# Patient Record
Sex: Female | Born: 1990 | Race: Black or African American | Hispanic: No | Marital: Single | Smoking: Current some day smoker
Health system: Southern US, Community
[De-identification: ages and names within clinical notes are randomized; demographics above are authoritative.]

## PROBLEM LIST (undated history)

## (undated) ENCOUNTER — Inpatient Hospital Stay (HOSPITAL_COMMUNITY): Payer: Self-pay

## (undated) DIAGNOSIS — T85848A Pain due to other internal prosthetic devices, implants and grafts, initial encounter: Secondary | ICD-10-CM

## (undated) DIAGNOSIS — S82891A Other fracture of right lower leg, initial encounter for closed fracture: Secondary | ICD-10-CM

## (undated) HISTORY — PX: FRACTURE SURGERY: SHX138

## (undated) HISTORY — PX: HERNIA REPAIR: SHX51

---

## 2012-02-29 ENCOUNTER — Encounter (HOSPITAL_COMMUNITY): Payer: Self-pay | Admitting: *Deleted

## 2012-02-29 ENCOUNTER — Emergency Department (HOSPITAL_COMMUNITY)
Admission: EM | Admit: 2012-02-29 | Discharge: 2012-02-29 | Disposition: A | Discharge status: Home / Self Care | Payer: Medicaid Other | Attending: Emergency Medicine | Admitting: Emergency Medicine

## 2012-02-29 DIAGNOSIS — N390 Urinary tract infection, site not specified: Secondary | ICD-10-CM | POA: Insufficient documentation

## 2012-02-29 LAB — URINE MICROSCOPIC-ADD ON

## 2012-02-29 LAB — URINALYSIS, ROUTINE W REFLEX MICROSCOPIC
Glucose, UA: NEGATIVE mg/dL
Protein, ur: 100 mg/dL — AB
Specific Gravity, Urine: 1.029 (ref 1.005–1.030)
pH: 6 (ref 5.0–8.0)

## 2012-02-29 MED ORDER — NITROFURANTOIN MONOHYD MACRO 100 MG PO CAPS
100.0000 mg | ORAL_CAPSULE | Freq: Once | ORAL | Status: AC
  Administered 2012-02-29: 100 mg via ORAL
  Filled 2012-02-29: qty 1

## 2012-02-29 MED ORDER — NITROFURANTOIN MONOHYD MACRO 100 MG PO CAPS: 100.0000 mg | ORAL_CAPSULE | Freq: Two times a day (BID) | ORAL | Status: AC

## 2012-02-29 MED ORDER — PHENAZOPYRIDINE HCL 200 MG PO TABS: 200.0000 mg | ORAL_TABLET | Freq: Three times a day (TID) | ORAL | Status: AC

## 2012-02-29 NOTE — Discharge Instructions (Signed)

## 2012-02-29 NOTE — ED Provider Notes (Signed)
History     CSN: [id number]  Arrival date & time 02/29/12  2956   First MD Initiated Contact with Patient 02/29/12 0220      Chief Complaint  Patient presents with  . Dysuria    (Consider location/radiation/quality/duration/timing/severity/associated sxs/prior treatment) Patient is a 21 y.o. female presenting with dysuria. The history is provided by the patient. No language interpreter was used.  Dysuria  This is a new problem. The current episode started more than 2 days ago (7 days ago). The problem occurs every urination. The problem has been gradually worsening. The quality of the pain is described as burning. The pain is moderate. There has been no fever. She is sexually active. There is no history of pyelonephritis. Associated symptoms include frequency and urgency. Pertinent negatives include no chills, no nausea, no vomiting, no discharge, no hematuria, no hesitancy, no possible pregnancy and no flank pain. Her past medical history does not include recurrent UTIs.    History reviewed. No pertinent past medical history.  History reviewed. No pertinent past surgical history.  No family history on file.  History  Substance Use Topics  . Smoking status: Not on file  . Smokeless tobacco: Not on file  . Alcohol Use: No    OB History    Grav Para Term Preterm Abortions TAB SAB Ect Mult Living                  Review of Systems  Constitutional: Negative for fever, chills, activity change, appetite change and fatigue.  HENT: Negative for congestion, sore throat, rhinorrhea, neck pain and neck stiffness.   Respiratory: Negative for cough and shortness of breath.   Cardiovascular: Negative for chest pain and palpitations.  Gastrointestinal: Negative for nausea, vomiting and abdominal pain.  Genitourinary: Positive for dysuria, urgency and frequency. Negative for hesitancy, hematuria, flank pain, vaginal bleeding and vaginal discharge.  Musculoskeletal: Negative for  myalgias, back pain and arthralgias.  Neurological: Negative for dizziness, weakness, light-headedness, numbness and headaches.  All other systems reviewed and are negative.    Allergies  Review of patient's allergies indicates no known allergies.  Home Medications   Current Outpatient Rx  Name Route Sig Dispense Refill  . NITROFURANTOIN MONOHYD MACRO 100 MG PO CAPS Oral Take 1 capsule (100 mg total) by mouth 2 (two) times daily. 14 capsule 0  . PHENAZOPYRIDINE HCL 200 MG PO TABS Oral Take 1 tablet (200 mg total) by mouth 3 (three) times daily. 6 tablet 0    BP 127/65  Pulse 88  Temp(Src) 98.3 F (36.8 C) (Oral)  Resp 19  SpO2 100%  Physical Exam  Nursing note and vitals reviewed. Constitutional: She is oriented to person, place, and time. She appears well-developed and well-nourished. No distress.  HENT:  Head: Normocephalic and atraumatic.  Mouth/Throat: Oropharynx is clear and moist. No oropharyngeal exudate.  Eyes: Conjunctivae and EOM are normal. Pupils are equal, round, and reactive to light.  Neck: Normal range of motion. Neck supple.  Cardiovascular: Normal rate, regular rhythm, normal heart sounds and intact distal pulses.  Exam reveals no gallop and no friction rub.   No murmur heard. Pulmonary/Chest: Effort normal and breath sounds normal.  Abdominal: Soft. Bowel sounds are normal. There is tenderness (mild suprapubic abdominal pain). There is no rebound and no guarding.  Musculoskeletal: Normal range of motion. She exhibits no tenderness.  Neurological: She is alert and oriented to person, place, and time. No cranial nerve deficit.  Skin: Skin is warm and dry.  No rash noted.    ED Course  Procedures (including critical care time)  Labs Reviewed  URINALYSIS, ROUTINE W REFLEX MICROSCOPIC - Abnormal; Notable for the following:    APPearance TURBID (*)    Hgb urine dipstick LARGE (*)    Protein, ur 100 (*)    Nitrite POSITIVE (*)    Leukocytes, UA MODERATE  (*)    All other components within normal limits  URINE MICROSCOPIC-ADD ON - Abnormal; Notable for the following:    Squamous Epithelial / LPF MANY (*)    Bacteria, UA MANY (*)    All other components within normal limits   No results found.   1. UTI (lower urinary tract infection)       MDM  Urinary tract infection with no evidence of pyelonephritis. She received a dose of Macrobid and numerous department. Be discharged home with a prescription for the same as well as a prescription for Pyridium. Is provided prescription return precautions. Struck to followup with her primary care physician         Dayton Bailiff, MD 02/29/12 [phone]

## 2012-02-29 NOTE — ED Notes (Signed)
Pt began having lower abdominal/bladder pain x 1 week ago.  Pt presents tonight with worsening pain and pressure.  Pt describes pain as like that associated with bladder infections in the past.

## 2013-05-10 DIAGNOSIS — S82891A Other fracture of right lower leg, initial encounter for closed fracture: Secondary | ICD-10-CM

## 2013-05-10 HISTORY — DX: Other fracture of right lower leg, initial encounter for closed fracture: S82.891A

## 2013-05-10 HISTORY — PX: ANKLE FRACTURE SURGERY: SHX122

## 2013-05-14 ENCOUNTER — Ambulatory Visit: Payer: Self-pay

## 2013-05-15 ENCOUNTER — Ambulatory Visit: Payer: Self-pay

## 2013-05-19 ENCOUNTER — Ambulatory Visit: Payer: Self-pay

## 2013-05-21 ENCOUNTER — Ambulatory Visit (INDEPENDENT_AMBULATORY_CARE_PROVIDER_SITE_OTHER): Payer: Medicaid Other | Admitting: Family Medicine

## 2013-05-21 DIAGNOSIS — Z3049 Encounter for surveillance of other contraceptives: Secondary | ICD-10-CM

## 2013-05-21 DIAGNOSIS — Z309 Encounter for contraceptive management, unspecified: Secondary | ICD-10-CM

## 2013-05-21 MED ORDER — MEDROXYPROGESTERONE ACETATE 150 MG/ML IM SUSP
150.0000 mg | Freq: Once | INTRAMUSCULAR | Status: AC
  Administered 2013-05-21: 150 mg via INTRAMUSCULAR

## 2013-05-23 ENCOUNTER — Emergency Department (HOSPITAL_COMMUNITY): Payer: Medicaid Other

## 2013-05-23 ENCOUNTER — Encounter (HOSPITAL_COMMUNITY)
Admission: EM | Disposition: A | Discharge status: Home / Self Care | Payer: Self-pay | Source: Home / Self Care | Attending: Emergency Medicine

## 2013-05-23 ENCOUNTER — Encounter (HOSPITAL_COMMUNITY): Payer: Self-pay | Admitting: Anesthesiology

## 2013-05-23 ENCOUNTER — Encounter (HOSPITAL_COMMUNITY): Payer: Self-pay

## 2013-05-23 ENCOUNTER — Emergency Department (HOSPITAL_COMMUNITY): Payer: Medicaid Other | Admitting: Anesthesiology

## 2013-05-23 ENCOUNTER — Observation Stay (HOSPITAL_COMMUNITY)
Admission: EM | Admit: 2013-05-23 | Discharge: 2013-05-25 | Disposition: A | Discharge status: Home / Self Care | Payer: Medicaid Other | Attending: Orthopedic Surgery | Admitting: Orthopedic Surgery

## 2013-05-23 DIAGNOSIS — S82843B Displaced bimalleolar fracture of unspecified lower leg, initial encounter for open fracture type I or II: Principal | ICD-10-CM | POA: Insufficient documentation

## 2013-05-23 DIAGNOSIS — S0990XA Unspecified injury of head, initial encounter: Secondary | ICD-10-CM

## 2013-05-23 DIAGNOSIS — S93439A Sprain of tibiofibular ligament of unspecified ankle, initial encounter: Secondary | ICD-10-CM | POA: Insufficient documentation

## 2013-05-23 DIAGNOSIS — S82841C Displaced bimalleolar fracture of right lower leg, initial encounter for open fracture type IIIA, IIIB, or IIIC: Secondary | ICD-10-CM

## 2013-05-23 DIAGNOSIS — S86909A Unspecified injury of unspecified muscle(s) and tendon(s) at lower leg level, unspecified leg, initial encounter: Secondary | ICD-10-CM | POA: Insufficient documentation

## 2013-05-23 DIAGNOSIS — S82899B Other fracture of unspecified lower leg, initial encounter for open fracture type I or II: Secondary | ICD-10-CM

## 2013-05-23 HISTORY — PX: I & D EXTREMITY: SHX5045

## 2013-05-23 HISTORY — PX: ORIF ANKLE FRACTURE: SHX5408

## 2013-05-23 LAB — BASIC METABOLIC PANEL WITH GFR
CO2: 19 meq/L (ref 19–32)
Calcium: 9 mg/dL (ref 8.4–10.5)
Chloride: 103 meq/L (ref 96–112)
Glucose, Bld: 107 mg/dL — ABNORMAL HIGH (ref 70–99)
Sodium: 138 meq/L (ref 135–145)

## 2013-05-23 LAB — BASIC METABOLIC PANEL
BUN: 14 mg/dL (ref 6–23)
Creatinine, Ser: 0.79 mg/dL (ref 0.50–1.10)
GFR calc Af Amer: >90 mL/min (ref >90)
GFR calc non Af Amer: >90 mL/min (ref >90)
Potassium: 3.3 mEq/L — ABNORMAL LOW (ref 3.5–5.1)

## 2013-05-23 LAB — CBC WITH DIFFERENTIAL/PLATELET
Basophils Absolute: 0.1 K/uL (ref 0.0–0.1)
Basophils Relative: 1 % (ref 0–1)
Eosinophils Absolute: 0 10*3/uL (ref 0.0–0.7)
Eosinophils Relative: 1 % (ref 0–5)
HCT: 41.3 % (ref 36.0–46.0)
Hemoglobin: 14 g/dL (ref 12.0–15.0)
Lymphocytes Relative: 27 % (ref 12–46)
Lymphs Abs: 1.7 K/uL (ref 0.7–4.0)
MCH: 26.2 pg (ref 26.0–34.0)
MCHC: 33.9 g/dL (ref 30.0–36.0)
MCV: 77.2 fL — ABNORMAL LOW (ref 78.0–100.0)
Monocytes Absolute: 0.4 K/uL (ref 0.1–1.0)
Monocytes Relative: 7 % (ref 3–12)
Neutro Abs: 4 K/uL (ref 1.7–7.7)
Neutrophils Relative %: 64 % (ref 43–77)
Platelets: 295 K/uL (ref 150–400)
RBC: 5.35 MIL/uL — ABNORMAL HIGH (ref 3.87–5.11)
RDW: 14.6 % (ref 11.5–15.5)
WBC: 6.3 K/uL (ref 4.0–10.5)

## 2013-05-23 LAB — URINALYSIS, ROUTINE W REFLEX MICROSCOPIC
Glucose, UA: NEGATIVE mg/dL
Hgb urine dipstick: NEGATIVE
Ketones, ur: NEGATIVE mg/dL
Leukocytes, UA: NEGATIVE
Nitrite: NEGATIVE
Protein, ur: NEGATIVE mg/dL
Specific Gravity, Urine: 1.025 (ref 1.005–1.030)
Urobilinogen, UA: 1 mg/dL (ref 0.0–1.0)
pH: 5 (ref 5.0–8.0)

## 2013-05-23 LAB — SAMPLE TO BLOOD BANK

## 2013-05-23 LAB — HCG, SERUM, QUALITATIVE: Preg, Serum: NEGATIVE

## 2013-05-23 LAB — ETHANOL: Alcohol, Ethyl (B): <11 mg/dL (ref 0–11)

## 2013-05-23 SURGERY — IRRIGATION AND DEBRIDEMENT EXTREMITY
Anesthesia: General | Site: Ankle | Laterality: Right | Wound class: Clean Contaminated

## 2013-05-23 MED ORDER — HYDROMORPHONE HCL PF 1 MG/ML IJ SOLN
0.2500 mg | INTRAMUSCULAR | Status: DC | PRN
  Administered 2013-05-23: 0.5 mg via INTRAVENOUS

## 2013-05-23 MED ORDER — SODIUM CHLORIDE 0.9 % IR SOLN
Status: DC | PRN
  Administered 2013-05-23 (×2): 3000 mL
  Administered 2013-05-23: 1000 mL

## 2013-05-23 MED ORDER — ONDANSETRON HCL 4 MG/2ML IJ SOLN: 4.0000 mg | Freq: Four times a day (QID) | INTRAMUSCULAR | Status: DC | PRN

## 2013-05-23 MED ORDER — ENOXAPARIN SODIUM 40 MG/0.4ML ~~LOC~~ SOLN: 40.0000 mg | SUBCUTANEOUS | Status: DC

## 2013-05-23 MED ORDER — POTASSIUM CHLORIDE IN NACL 20-0.9 MEQ/L-% IV SOLN
Freq: Once | INTRAVENOUS | Status: AC
  Administered 2013-05-23: 06:00:00 via INTRAVENOUS
  Filled 2013-05-23: qty 1000

## 2013-05-23 MED ORDER — SODIUM CHLORIDE 0.9 % IV SOLN
Freq: Once | INTRAVENOUS | Status: AC
  Administered 2013-05-23: 02:00:00 via INTRAVENOUS

## 2013-05-23 MED ORDER — PROPOFOL 10 MG/ML IV BOLUS
INTRAVENOUS | Status: DC | PRN
  Administered 2013-05-23: 50 mg via INTRAVENOUS
  Administered 2013-05-23: 150 mg via INTRAVENOUS

## 2013-05-23 MED ORDER — HYDROMORPHONE HCL PF 1 MG/ML IJ SOLN
INTRAMUSCULAR | Status: AC
  Administered 2013-05-23: 0.5 mg via INTRAVENOUS
  Filled 2013-05-23: qty 1

## 2013-05-23 MED ORDER — OXYCODONE HCL 5 MG PO TABS
5.0000 mg | ORAL_TABLET | ORAL | Status: DC | PRN
  Administered 2013-05-23 – 2013-05-24 (×4): 10 mg via ORAL
  Filled 2013-05-23 (×4): qty 2

## 2013-05-23 MED ORDER — DIPHENHYDRAMINE HCL 25 MG PO CAPS
25.0000 mg | ORAL_CAPSULE | Freq: Four times a day (QID) | ORAL | Status: DC | PRN
  Administered 2013-05-23 – 2013-05-24 (×4): 25 mg via ORAL
  Filled 2013-05-23 (×4): qty 1

## 2013-05-23 MED ORDER — CEFAZOLIN SODIUM 1-5 GM-% IV SOLN
1.0000 g | Freq: Once | INTRAVENOUS | Status: AC
  Administered 2013-05-23: 1 g via INTRAVENOUS
  Filled 2013-05-23: qty 50

## 2013-05-23 MED ORDER — FENTANYL CITRATE 0.05 MG/ML IJ SOLN
INTRAMUSCULAR | Status: DC | PRN
  Administered 2013-05-23 (×10): 50 ug via INTRAVENOUS

## 2013-05-23 MED ORDER — POTASSIUM CHLORIDE IN NACL 20-0.9 MEQ/L-% IV SOLN
INTRAVENOUS | Status: DC | PRN
  Administered 2013-05-23: 100 mL/h via INTRAVENOUS

## 2013-05-23 MED ORDER — LACTATED RINGERS IV SOLN
INTRAVENOUS | Status: DC | PRN
  Administered 2013-05-23 (×2): via INTRAVENOUS

## 2013-05-23 MED ORDER — CEFAZOLIN SODIUM-DEXTROSE 2-3 GM-% IV SOLR
2.0000 g | Freq: Four times a day (QID) | INTRAVENOUS | Status: AC
  Administered 2013-05-23 – 2013-05-24 (×3): 2 g via INTRAVENOUS
  Filled 2013-05-23 (×3): qty 50

## 2013-05-23 MED ORDER — HYDROMORPHONE HCL PF 1 MG/ML IJ SOLN
1.0000 mg | INTRAMUSCULAR | Status: AC
  Administered 2013-05-23: 1 mg via INTRAVENOUS
  Filled 2013-05-23: qty 1

## 2013-05-23 MED ORDER — LIDOCAINE HCL (CARDIAC) 20 MG/ML IV SOLN
INTRAVENOUS | Status: DC | PRN
  Administered 2013-05-23: 60 mg via INTRAVENOUS

## 2013-05-23 MED ORDER — HYDROMORPHONE HCL PF 1 MG/ML IJ SOLN
0.5000 mg | Freq: Once | INTRAMUSCULAR | Status: AC
  Administered 2013-05-23: 0.5 mg via INTRAVENOUS
  Filled 2013-05-23: qty 1

## 2013-05-23 MED ORDER — ALBUMIN HUMAN 5 % IV SOLN
INTRAVENOUS | Status: DC | PRN
  Administered 2013-05-23: 08:00:00 via INTRAVENOUS

## 2013-05-23 MED ORDER — SENNOSIDES 8.6 MG PO TABS: 2.0000 | ORAL_TABLET | Freq: Every day | ORAL | Status: DC

## 2013-05-23 MED ORDER — ONDANSETRON HCL 4 MG/2ML IJ SOLN
4.0000 mg | Freq: Once | INTRAMUSCULAR | Status: AC
  Administered 2013-05-23: 4 mg via INTRAVENOUS
  Filled 2013-05-23: qty 2

## 2013-05-23 MED ORDER — ENOXAPARIN SODIUM 40 MG/0.4ML ~~LOC~~ SOLN
40.0000 mg | SUBCUTANEOUS | Status: DC
  Administered 2013-05-23 – 2013-05-24 (×2): 40 mg via SUBCUTANEOUS
  Filled 2013-05-23 (×3): qty 0.4

## 2013-05-23 MED ORDER — SODIUM CHLORIDE 0.9 % IV SOLN
INTRAVENOUS | Status: DC
  Administered 2013-05-23: 14:00:00 via INTRAVENOUS

## 2013-05-23 MED ORDER — DOCUSATE SODIUM 100 MG PO CAPS
100.0000 mg | ORAL_CAPSULE | Freq: Two times a day (BID) | ORAL | Status: DC
  Administered 2013-05-23 – 2013-05-25 (×4): 100 mg via ORAL
  Filled 2013-05-23 (×6): qty 1

## 2013-05-23 MED ORDER — SENNA 8.6 MG PO TABS
2.0000 | ORAL_TABLET | Freq: Two times a day (BID) | ORAL | Status: DC
  Administered 2013-05-23 – 2013-05-25 (×4): 17.2 mg via ORAL
  Filled 2013-05-23 (×4): qty 2
  Filled 2013-05-23: qty 1

## 2013-05-23 MED ORDER — BACITRACIN ZINC 500 UNIT/GM EX OINT
TOPICAL_OINTMENT | CUTANEOUS | Status: DC | PRN
  Administered 2013-05-23: 1 via TOPICAL

## 2013-05-23 MED ORDER — HYDROMORPHONE HCL PF 1 MG/ML IJ SOLN
0.5000 mg | INTRAMUSCULAR | Status: DC | PRN
  Administered 2013-05-23 – 2013-05-24 (×2): 1 mg via INTRAVENOUS
  Filled 2013-05-23 (×2): qty 1

## 2013-05-23 MED ORDER — OXYCODONE HCL 5 MG PO TABS: 5.0000 mg | ORAL_TABLET | ORAL | Status: DC | PRN

## 2013-05-23 MED ORDER — DOCUSATE SODIUM 100 MG PO CAPS: 100.0000 mg | ORAL_CAPSULE | Freq: Two times a day (BID) | ORAL | Status: DC

## 2013-05-23 MED ORDER — ONDANSETRON HCL 4 MG/2ML IJ SOLN
INTRAMUSCULAR | Status: DC | PRN
  Administered 2013-05-23: 4 mg via INTRAVENOUS

## 2013-05-23 MED ORDER — CEFAZOLIN SODIUM-DEXTROSE 2-3 GM-% IV SOLR
INTRAVENOUS | Status: DC | PRN
  Administered 2013-05-23: 2 g via INTRAVENOUS

## 2013-05-23 MED ORDER — ETOMIDATE 2 MG/ML IV SOLN
12.0000 mg | Freq: Once | INTRAVENOUS | Status: AC
  Administered 2013-05-23: 12 mg via INTRAVENOUS
  Filled 2013-05-23: qty 10

## 2013-05-23 MED ORDER — SUCCINYLCHOLINE CHLORIDE 20 MG/ML IJ SOLN
INTRAMUSCULAR | Status: DC | PRN
  Administered 2013-05-23: 140 mg via INTRAVENOUS

## 2013-05-23 MED ORDER — ASPIRIN EC 325 MG PO TBEC: 325.0000 mg | DELAYED_RELEASE_TABLET | Freq: Every day | ORAL | Status: DC

## 2013-05-23 MED ORDER — ONDANSETRON HCL 4 MG PO TABS
4.0000 mg | ORAL_TABLET | Freq: Four times a day (QID) | ORAL | Status: DC | PRN
  Administered 2013-05-23: 4 mg via ORAL
  Filled 2013-05-23: qty 1

## 2013-05-23 SURGICAL SUPPLY — 73 items
BANDAGE CONFORM 3  STR LF (GAUZE/BANDAGES/DRESSINGS) ×3 IMPLANT
BANDAGE ELASTIC 4 VELCRO ST LF (GAUZE/BANDAGES/DRESSINGS) ×3 IMPLANT
BANDAGE ESMARK 6X9 LF (GAUZE/BANDAGES/DRESSINGS) ×2 IMPLANT
BIT DRILL 2.5X2.75 QC CALB (BIT) ×2 IMPLANT
BIT DRILL 2.9 CANN QC NONSTRL (BIT) ×2 IMPLANT
BLADE SURG 10 STRL SS (BLADE) ×1 IMPLANT
BLADE SURG 15 STRL LF DISP TIS (BLADE) ×1 IMPLANT
BLADE SURG 15 STRL SS (BLADE)
BNDG CMPR 9X6 STRL LF SNTH (GAUZE/BANDAGES/DRESSINGS) ×2
BNDG COHESIVE 4X5 TAN STRL (GAUZE/BANDAGES/DRESSINGS) ×3 IMPLANT
BNDG COHESIVE 6X5 TAN STRL LF (GAUZE/BANDAGES/DRESSINGS) ×1 IMPLANT
BNDG ESMARK 6X9 LF (GAUZE/BANDAGES/DRESSINGS) ×3
CHLORAPREP W/TINT 26ML (MISCELLANEOUS) ×1 IMPLANT
CLOTH BEACON ORANGE TIMEOUT ST (SAFETY) ×3 IMPLANT
COVER SURGICAL LIGHT HANDLE (MISCELLANEOUS) ×3 IMPLANT
CUFF TOURNIQUET SINGLE 34IN LL (TOURNIQUET CUFF) ×3 IMPLANT
CUFF TOURNIQUET SINGLE 44IN (TOURNIQUET CUFF) IMPLANT
DRAIN PENROSE 1/2X12 LTX STRL (WOUND CARE) IMPLANT
DRAPE C-ARM 42X72 X-RAY (DRAPES) ×3 IMPLANT
DRAPE C-ARMOR (DRAPES) ×3 IMPLANT
DRAPE U-SHAPE 47X51 STRL (DRAPES) ×3 IMPLANT
DRSG ADAPTIC 3X8 NADH LF (GAUZE/BANDAGES/DRESSINGS) ×3 IMPLANT
DRSG PAD ABDOMINAL 8X10 ST (GAUZE/BANDAGES/DRESSINGS) ×4 IMPLANT
ELECT REM PT RETURN 9FT ADLT (ELECTROSURGICAL) ×3
ELECTRODE REM PT RTRN 9FT ADLT (ELECTROSURGICAL) ×2 IMPLANT
GLOVE BIO SURGEON STRL SZ8 (GLOVE) ×3 IMPLANT
GLOVE BIOGEL PI IND STRL 8 (GLOVE) ×3 IMPLANT
GLOVE BIOGEL PI INDICATOR 8 (GLOVE) ×2
GOWN PREVENTION PLUS XLARGE (GOWN DISPOSABLE) ×7 IMPLANT
GOWN STRL NON-REIN LRG LVL3 (GOWN DISPOSABLE) ×2 IMPLANT
K-WIRE ACE 1.6X6 (WIRE) ×6
KIT BASIN OR (CUSTOM PROCEDURE TRAY) ×3 IMPLANT
KIT ROOM TURNOVER OR (KITS) ×3 IMPLANT
KWIRE ACE 1.6X6 (WIRE) ×2 IMPLANT
MANIFOLD NEPTUNE II (INSTRUMENTS) ×3 IMPLANT
NEEDLE 22X1 1/2 (OR ONLY) (NEEDLE) IMPLANT
NS IRRIG 1000ML POUR BTL (IV SOLUTION) ×3 IMPLANT
PACK ORTHO EXTREMITY (CUSTOM PROCEDURE TRAY) ×3 IMPLANT
PAD ARMBOARD 7.5X6 YLW CONV (MISCELLANEOUS) ×4 IMPLANT
PAD CAST 4YDX4 CTTN HI CHSV (CAST SUPPLIES) ×2 IMPLANT
PADDING CAST COTTON 4X4 STRL (CAST SUPPLIES) ×3
PADDING CAST COTTON 6X4 STRL (CAST SUPPLIES) ×2 IMPLANT
PLATE ACE 100DEG 7HOLE (Plate) ×2 IMPLANT
SCREW ACE CAN 4.0 44M (Screw) ×4 IMPLANT
SCREW CORTICAL 3.5MM  16MM (Screw) ×4 IMPLANT
SCREW CORTICAL 3.5MM  42MM (Screw) ×1 IMPLANT
SCREW CORTICAL 3.5MM  44MM (Screw) ×1 IMPLANT
SCREW CORTICAL 3.5MM 16MM (Screw) ×4 IMPLANT
SCREW CORTICAL 3.5MM 42MM (Screw) ×1 IMPLANT
SCREW CORTICAL 3.5MM 44MM (Screw) ×1 IMPLANT
SOAP 2 % CHG 4 OZ (WOUND CARE) ×1 IMPLANT
SPLINT PLASTER CAST XFAST 5X30 (CAST SUPPLIES) ×1 IMPLANT
SPLINT PLASTER XFAST SET 5X30 (CAST SUPPLIES) ×1
SPONGE GAUZE 4X4 12PLY (GAUZE/BANDAGES/DRESSINGS) ×4 IMPLANT
SPONGE LAP 18X18 X RAY DECT (DISPOSABLE) ×3 IMPLANT
STAPLER VISISTAT 35W (STAPLE) IMPLANT
SUCTION FRAZIER TIP 10 FR DISP (SUCTIONS) ×3 IMPLANT
SUT ETHILON 2 0 FS 18 (SUTURE) ×2 IMPLANT
SUT ETHILON 3 0 FSL (SUTURE) IMPLANT
SUT ETHILON 3 0 PS 1 (SUTURE) ×2 IMPLANT
SUT MNCRL AB 3-0 PS2 18 (SUTURE) ×2 IMPLANT
SUT PROLENE 3 0 PS 2 (SUTURE) ×1 IMPLANT
SUT VIC AB 2-0 CT1 27 (SUTURE)
SUT VIC AB 2-0 CT1 TAPERPNT 27 (SUTURE) ×2 IMPLANT
SUT VIC AB 3-0 PS2 18 (SUTURE)
SUT VIC AB 3-0 PS2 18XBRD (SUTURE) ×1 IMPLANT
SYR CONTROL 10ML LL (SYRINGE) IMPLANT
TOWEL OR 17X24 6PK STRL BLUE (TOWEL DISPOSABLE) ×3 IMPLANT
TOWEL OR 17X26 10 PK STRL BLUE (TOWEL DISPOSABLE) ×3 IMPLANT
TUBE CONNECTING 12X1/4 (SUCTIONS) ×3 IMPLANT
TUBING CYSTO DISP (UROLOGICAL SUPPLIES) ×3 IMPLANT
WATER STERILE IRR 1000ML POUR (IV SOLUTION) ×1 IMPLANT
YANKAUER SUCT BULB TIP NO VENT (SUCTIONS) ×3 IMPLANT

## 2013-05-23 NOTE — ED Notes (Signed)
Vital signs stable. 

## 2013-05-23 NOTE — ED Notes (Signed)
Pt remains resting at this time. vss.

## 2013-05-23 NOTE — Transfer of Care (Signed)
Immediate Anesthesia Transfer of Care Note  Patient: [name] [name]  Procedure(s) Performed: Procedure(s): IRRIGATION AND DEBRIDEMENT EXTREMITY (Right) OPEN REDUCTION INTERNAL FIXATION (ORIF) ANKLE FRACTURE (Right)  Patient Location: PACU  Anesthesia Type:General  Level of Consciousness: sedated  Airway & Oxygen Therapy: Patient Spontanous Breathing and Patient connected to nasal cannula oxygen  Post-op Assessment: Report given to PACU RN and Post -op Vital signs reviewed and stable  Post vital signs: Reviewed and stable  Complications: No apparent anesthesia complications
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
## 2013-05-23 NOTE — ED Notes (Signed)
Family updated as to patient's status.

## 2013-05-23 NOTE — Anesthesia Procedure Notes (Signed)
Procedure Name: Intubation Date/Time: 05/23/2013 7:51 AM Performed by: Alanda Amass A Pre-anesthesia Checklist: Patient identified, Timeout performed, Emergency Drugs available, Suction available and Patient being monitored Patient Re-evaluated:Patient Re-evaluated prior to inductionOxygen Delivery Method: Circle system utilized Preoxygenation: Pre-oxygenation with 100% oxygen Intubation Type: IV induction, Rapid sequence and Cricoid Pressure applied Tube type: Oral Tube size: 7.5 mm Number of attempts: 1 Airway Equipment and Method: Stylet and Video-laryngoscopy Placement Confirmation: ETT inserted through vocal cords under direct vision,  breath sounds checked- equal and bilateral and positive ETCO2 Secured at: 21 cm Tube secured with: Tape Dental Injury: Teeth and Oropharynx as per pre-operative assessment

## 2013-05-23 NOTE — Anesthesia Preprocedure Evaluation (Addendum)
Anesthesia Evaluation  Patient identified by MRN, date of birth, ID band Patient awake    Reviewed: Allergy & Precautions, H&P , NPO status , Patient's Chart, lab work & pertinent test results  Airway Mallampati: II TM Distance: >3 FB Neck ROM: Full    Dental no notable dental hx. (+) Teeth Intact and Dental Advisory Given   Pulmonary Current Smoker,          Cardiovascular Rhythm:Regular Rate:Normal     Neuro/Psych    GI/Hepatic negative GI ROS, Neg liver ROS,   Endo/Other  negative endocrine ROS  Renal/GU negative Renal ROS     Musculoskeletal   Abdominal   Peds  Hematology   Anesthesia Other Findings   Reproductive/Obstetrics                         Anesthesia Physical Anesthesia Plan  ASA: II  Anesthesia Plan: General   Post-op Pain Management:    Induction: Intravenous  Airway Management Planned: Oral ETT  Additional Equipment:   Intra-op Plan:   Post-operative Plan: Extubation in OR  Informed Consent: I have reviewed the patients History and Physical, chart, labs and discussed the procedure including the risks, benefits and alternatives for the proposed anesthesia with the patient or authorized representative who has indicated his/her understanding and acceptance.   Dental advisory given  Plan Discussed with: CRNA, Anesthesiologist and Surgeon  Anesthesia Plan Comments:        Anesthesia Quick Evaluation

## 2013-05-23 NOTE — ED Notes (Signed)
md along with ortho tech attempting to reduce dislocation

## 2013-05-23 NOTE — ED Notes (Signed)
Pt sts needs to pee but unable to tolerated bedpan.  Spoke with md and new orders obtained for f/c insertion.
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
## 2013-05-23 NOTE — Brief Op Note (Signed)
05/23/2013  9:43 AM  PATIENT:  [name] [name]  22 y.o. female  PRE-OPERATIVE DIAGNOSIS:  open fracture dislocation right ankle  POST-OPERATIVE DIAGNOSIS:   1.  Open ankle fracture dislocation      2.  Right ankle medial laceration 7 cm long      3.  Right ankle bimalleolar fracture      4.  Right ankle syndesmosis disruption      5.  Laceration of right posterior tibial tendon  Procedure(s): 1.  Irrigation and excisional debridement of right ankle open fracture dislocation including skin, subcut, muscle and bone   2.  Tenolysis of the posterior tibial tendon   3.  ORIF of right ankle bimalleolar fracture   4.  ORIF of right ankle syndesmosis   5.  Intermediate closure of right ankle wound 7 cm in length  SURGEON:  Toni Arthurs, MD  ASSISTANT: n/a  ANESTHESIA:   General  EBL:  minimal   TOURNIQUET:   Total Tourniquet Time Documented: area (laterality) - 80 minutes Total: area (laterality) - 80 minutes   COMPLICATIONS:  None apparent  DISPOSITION:  Extubated, awake and stable to recovery.  DICTATION ID:  [id number]

## 2013-05-23 NOTE — ED Notes (Signed)
Ortho md at bedside.

## 2013-05-23 NOTE — Anesthesia Postprocedure Evaluation (Signed)
  Anesthesia Post-op Note  Patient: [name] [name]  Procedure(s) Performed: Procedure(s): IRRIGATION AND DEBRIDEMENT EXTREMITY (Right) OPEN REDUCTION INTERNAL FIXATION (ORIF) ANKLE FRACTURE (Right)  Patient Location: PACU  Anesthesia Type:General  Level of Consciousness: awake  Airway and Oxygen Therapy: Patient Spontanous Breathing  Post-op Pain: mild  Post-op Assessment: Post-op Vital signs reviewed  Post-op Vital Signs: Reviewed  Complications: No apparent anesthesia complications

## 2013-05-23 NOTE — ED Notes (Signed)
Consent signed by pt and md. Pt transported to or via stretcher.

## 2013-05-23 NOTE — ED Notes (Signed)
Pt now responding appropriately. Ready to go to xray
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
## 2013-05-24 MED ORDER — POLYETHYLENE GLYCOL 3350 17 G PO PACK: 17.0000 g | PACK | Freq: Every day | ORAL | Status: DC | PRN

## 2013-05-24 MED ORDER — OXYCODONE HCL 5 MG PO TABS
5.0000 mg | ORAL_TABLET | ORAL | Status: DC | PRN
  Administered 2013-05-25 (×2): 10 mg via ORAL
  Filled 2013-05-24 (×3): qty 2

## 2013-05-24 MED ORDER — HYDROMORPHONE HCL PF 1 MG/ML IJ SOLN: 0.5000 mg | INTRAMUSCULAR | Status: DC | PRN

## 2013-05-24 NOTE — Progress Notes (Signed)
Subjective: 1 Day Post-Op Procedure(s) (LRB): IRRIGATION AND DEBRIDEMENT EXTREMITY (Right) OPEN REDUCTION INTERNAL FIXATION (ORIF) ANKLE FRACTURE (Right) Patient reports pain as well controlled.  OOB to chair and progressing well with PT. Patient is asleep, but is aroused with moderate stimuli. Her excessive grogginess maybe related to her medications, as stated by her mother how is at the bedside. Follows commands.   Objective: Vital signs in last 24 hours: Temp:  [98.4 F (36.9 C)-100.6 F (38.1 C)] 99.7 F (37.6 C) (06/15 0639) Pulse Rate:  [71-123] 80 (06/15 0639) Resp:  [16-23] 18 (06/15 0639) BP: (109-155)/(67-97) 150/94 mmHg (06/15 0639) SpO2:  [92 %-100 %] 98 % (06/15 0639) Weight:  [96.163 kg (212 lb)] 96.163 kg (212 lb) (06/14 1149)  Intake/Output from previous day: 06/14 0701 - 06/15 0700 In: 2566.7 [P.O.:360; I.V.:1906.7; IV Piggyback:300] Out: 3305 [Urine:3275; Blood:30] Intake/Output this shift:     Recent Labs  05/23/13 0225  HGB 14.0    Recent Labs  05/23/13 0225  WBC 6.3  RBC 5.35*  HCT 41.3  PLT 295    Recent Labs  05/23/13 0225  NA 138  K 3.3*  CL 103  CO2 19  BUN 14  CREATININE 0.79  GLUCOSE 107*  CALCIUM 9.0   No results found for this basename: LABPT, INR,  in the last 72 hours Up in chair. Groggy , but responsive. RLE neurovascularly intact. No signs of compartment syndrome. ROM intact of knee and toes on right LE. Splint C/D/I.   Assessment/Plan: 1 Day Post-Op Procedure(s) (LRB): IRRIGATION AND DEBRIDEMENT EXTREMITY (Right) OPEN REDUCTION INTERNAL FIXATION (ORIF) ANKLE FRACTURE (Right) Observe for another night, due to excessive grogginess. Taper pain medications with a focus on PO's only. Continue PT Case management for DME needs.  Plan for D/C home Tomorrow with family support.   Clarity Ciszek L 05/24/2013, 9:47 AM

## 2013-05-24 NOTE — Progress Notes (Signed)
Elimination of co-sign for 0830 documentation

## 2013-05-24 NOTE — Evaluation (Signed)
Physical Therapy Evaluation Patient Details Name: [name] [name] MRN: [id number] DOB: 02/11/1991 Today's Date: 05/24/2013 Time: 4098-1191 PT Time Calculation (min): 71 min  PT Assessment / Plan / Recommendation Clinical Impression  22 yo female s/p ORIF Right ankle presents with decr functional mobility; Eill benefit from PT to maximize independence and safety with mobility prior to dc home;   Spent extra time discussing managing at home with pt's mother, who had many questions;  Will hopefully be able to transition to crutches next session    PT Assessment  Patient needs continued PT services    Follow Up Recommendations  Home health PT;Supervision/Assistance - 24 hour    Does the patient have the potential to tolerate intense rehabilitation      Barriers to Discharge None      Equipment Recommendations  Rolling walker with 5" wheels (3in1, crutches)    Recommendations for Other Services     Frequency Min 6X/week    Precautions / Restrictions Restrictions Weight Bearing Restrictions: Yes RLE Weight Bearing: Non weight bearing   Pertinent Vitals/Pain 9/10 Right ankle; Premedicated for pain      Mobility  Bed Mobility Bed Mobility: Supine to Sit;Sitting - Scoot to Edge of Bed Supine to Sit: 4: Min assist;With rails Sitting - Scoot to [place] of Bed: 3: Mod assist (with bed pad) Details for Bed Mobility Assistance: Cues for technique; physical assist to help RLE off of teh bed; Used bed pad to square off hips at EOB Transfers Transfers: Sit to Stand;Stand to Sit Sit to Stand: 3: Mod assist;From bed;4: Min guard;From chair/3-in-1 Stand to Sit: 4: Min guard;To chair/3-in-1;With armrests Details for Transfer Assistance: Cues for technique; Required mod assist with intitial sit to stand from bed; Much better with more practice and use of armrests Ambulation/Gait Ambulation/Gait Assistance: 4: Min guard Ambulation Distance (Feet): 20 Feet Assistive device:  Rolling walker Ambulation/Gait Assistance Details: Verbal and demo cues for technique; Good NWB Gait Pattern: Step-to pattern    Exercises     PT Diagnosis: Difficulty walking;Acute pain  PT Problem List: Decreased activity tolerance;Decreased balance;Decreased mobility;Decreased knowledge of use of DME;Decreased knowledge of precautions;Pain PT Treatment Interventions: DME instruction;Gait training;Stair training;Functional mobility training;Therapeutic activities;Therapeutic exercise;Patient/family education   PT Goals Acute Rehab PT Goals PT Goal Formulation: With patient/family Time For Goal Achievement: 05/31/13 Potential to Achieve Goals: Good Pt will go Supine/Side to Sit: with modified independence PT Goal: Supine/Side to Sit - Progress: Goal set today Pt will go Sit to Supine/Side: with modified independence PT Goal: Sit to Supine/Side - Progress: Goal set today Pt will go Stand to Sit: with modified independence PT Goal: Stand to Sit - Progress: Goal set today Pt will Ambulate: >150 feet;with modified independence;with crutches;with rolling walker PT Goal: Ambulate - Progress: Goal set today  Visit Information  Last PT Received On: 05/24/13 Assistance Needed: +1    Subjective Data  Subjective: Pt very sleepy; Pt's mother quite nervous about getting everything right to go home Patient Stated Goal: Pt did not state; Mother wants to do things right   Prior Functioning  Home Living Lives With: Family Available Help at Discharge: Family;Available 24 hours/day Type of Home: House Home Access: Stairs to enter [employer] of Steps: 1 Entrance Stairs-Rails: None Home Layout: One level Bathroom Shower/Tub: Forensic scientist: Handicapped height Bathroom Accessibility: Yes How Accessible: Accessible via walker Home Adaptive Equipment: None Prior Function Level of Independence: Independent Able to Take Stairs?: Yes Driving: Yes Vocation:  Full time employment Communication  Communication: No difficulties    Cognition  Cognition Arousal/Alertness: Suspect due to medications Behavior During Therapy: WFL for tasks assessed/performed Overall Cognitive Status: Within Functional Limits for tasks assessed    Extremity/Trunk Assessment Right Upper Extremity Assessment RUE ROM/Strength/Tone: Within functional levels Left Upper Extremity Assessment LUE ROM/Strength/Tone: Within functional levels Right Lower Extremity Assessment RLE ROM/Strength/Tone: Deficits;Due to pain;Due to precautions RLE ROM/Strength/Tone Deficits: Requitring assist to elevate RLE against gravity; postivit active toe wigggle Left Lower Extremity Assessment LLE ROM/Strength/Tone: WFL for tasks assessed   Balance    End of Session PT - End of Session Equipment Utilized During Treatment: Gait belt Activity Tolerance: Other (comment) (very sleepy and slow moving with pain meds) Patient left: in chair;with call bell/phone within reach;with family/visitor present Nurse Communication: Mobility status  GP Functional Assessment Tool Used: Clinical Judgement Functional Limitation: Mobility: Walking and moving around Mobility: Walking and Moving Around Current Status [phone]): At least 1 percent but less than 20 percent impaired, limited or restricted Mobility: Walking and Moving Around Goal Status [phone]): 0 percent impaired, limited or restricted   Van Clines [facility] [place], [place] [phone]  05/24/2013, 10:26 AM

## 2013-05-25 NOTE — Discharge Summary (Signed)
Physician Discharge Summary  Patient ID: [name] [name] MRN: [id number] DOB/AGE: [birth year] 22 y.o.  Admit date: 05/23/2013 Discharge date: 05/25/2013  Admission Diagnoses:  Right open ankle fracture dislocation  Discharge Diagnoses:  Same s/p ORIF of bimal fracture and syndesmosis  Discharged Condition: stable  Hospital Course: Pt was admitted on 6/14 and taken immediately to the OR where she underwent I and D of her open fracture and ORIF of her bimal ankle fracture and syndesmosis disruption.  Post op she did well and made good progress with PT.  She is discharged to home in stable condition.  Consults: None  Significant Diagnostic Studies: none  Treatments: therapies: PT  Discharge Exam: Blood pressure 125/88, pulse 98, temperature 99.6 F (37.6 C), temperature source Oral, resp. rate 16, height 5\' 2"  (1.575 m), weight 96.163 kg (212 lb), SpO2 97.00%. WN WD young woman in nad.  A and O x 4.  L ankle splinted.  Toes with brisk cap refill and 5/5 strength in PF and DF.  Disposition: 01-Home or Self Care  Discharge Orders   Future Orders Complete By Expires     Call MD / Call 911  As directed     Comments:      If you experience chest pain or shortness of breath, CALL 911 and be transported to the hospital emergency room.  If you develope a fever above 101 F, pus (white drainage) or increased drainage or redness at the wound, or calf pain, call your surgeon's office.    Constipation Prevention  As directed     Comments:      Drink plenty of fluids.  Prune juice may be helpful.  You may use a stool softener, such as Colace (over the counter) 100 mg twice a day.  Use MiraLax (over the counter) for constipation as needed.    Diet - low sodium heart healthy  As directed     Increase activity slowly as tolerated  As directed         Medication List    TAKE these medications       aspirin EC 325 MG tablet  Take 1 tablet (325 mg total) by mouth daily.     docusate  sodium 100 MG capsule  Commonly known as:  COLACE  Take 1 capsule (100 mg total) by mouth 2 (two) times daily. While taking narcotic pain medicine.     enoxaparin 40 MG/0.4ML injection  Commonly known as:  LOVENOX  Inject 0.4 mLs (40 mg total) into the skin daily.     medroxyPROGESTERone 104 MG/0.65ML injection  Commonly known as:  DEPO-SUBQ PROVERA  Inject 104 mg into the skin every 3 (three) months.     oxyCODONE 5 MG immediate release tablet  Commonly known as:  ROXICODONE  Take 1-2 tablets (5-10 mg total) by mouth every 4 (four) hours as needed for pain.     senna 8.6 MG tablet  Commonly known as:  SENOKOT  Take 2 tablets (17.2 mg total) by mouth daily. While taking narcotic pain medicine.           Follow-up Information   Follow up with Octavian Godek, Jonny Ruiz, MD. Schedule an appointment as soon as possible for a visit in 2 weeks.   Contact information:   [address] [place] [address] [phone]       Signed: Toni Arthurs 05/25/2013, 7:46 AM

## 2013-05-25 NOTE — Progress Notes (Signed)
Physical Therapy Treatment Patient Details Name: [name] [name] MRN: [id number] DOB: [birth year] Today's Date: 05/25/2013 Time: 4098-1191 PT Time Calculation (min): 33 min  PT Assessment / Plan / Recommendation Comments on Treatment Session  Much better ability to participate; Slow moving, but requiring less assist; OK for dc home from PT standpoint; Rec HHPT to help with transition home, and problem-solving in home; Pt is OK with [facility]    Follow Up Recommendations  Home health PT;Supervision/Assistance - 24 hour     Does the patient have the potential to tolerate intense rehabilitation     Barriers to Discharge        Equipment Recommendations  Rolling walker with 5" wheels (3in1, crutches)    Recommendations for Other Services    Frequency Min 6X/week   Plan Discharge plan remains appropriate    Precautions / Restrictions Restrictions Weight Bearing Restrictions: Yes RLE Weight Bearing: Non weight bearing   Pertinent Vitals/Pain 9/10 pain, but still willing to participate    Mobility  Bed Mobility Bed Mobility: Supine to Sit;Sitting - Scoot to Edge of Bed Supine to Sit: 4: Min assist;With rails Sitting - Scoot to [place] of Bed: 4: Min assist (with bed pad) Details for Bed Mobility Assistance: Cues for technique; Transfers Transfers: Sit to Stand;Stand to Sit Sit to Stand: From bed;4: Min guard;From chair/3-in-1 Stand to Sit: 4: Min guard;To chair/3-in-1;With armrests Details for Transfer Assistance: Cues for technique Ambulation/Gait Ambulation/Gait Assistance: 4: Min guard Ambulation Distance (Feet): 30 Feet Assistive device: Rolling walker Ambulation/Gait Assistance Details: Initially attempted with crutches, took a few steps, and pt stated she prefers the RW, which i s steadier Gait Pattern: Step-to pattern    Exercises     PT Diagnosis:    PT Problem List:   PT Treatment Interventions:     PT Goals Acute Rehab PT  Goals PT Goal Formulation: With patient/family Time For Goal Achievement: 05/31/13 Potential to Achieve Goals: Good Pt will go Supine/Side to Sit: with modified independence PT Goal: Supine/Side to Sit - Progress: Progressing toward goal Pt will go Sit to Supine/Side: with modified independence Pt will go Stand to Sit: with modified independence PT Goal: Stand to Sit - Progress: Progressing toward goal Pt will Ambulate: >150 feet;with modified independence;with crutches;with rolling walker PT Goal: Ambulate - Progress: Progressing toward goal  Visit Information  Last PT Received On: 05/25/13 Assistance Needed: +1    Subjective Data  Subjective: Feeling better today; prefers RW over crutches Patient Stated Goal: Wants to go home today   Cognition  Cognition Arousal/Alertness: Awake/alert Behavior During Therapy: WFL for tasks assessed/performed Overall Cognitive Status: Within Functional Limits for tasks assessed    Balance     End of Session PT - End of Session Equipment Utilized During Treatment: Gait belt Activity Tolerance: Patient tolerated treatment well Patient left: in chair;with call bell/phone within reach;with family/visitor present Nurse Communication: Mobility status   GP     Olen Pel [place],  [phone]  05/25/2013, 12:30 PM

## 2013-05-25 NOTE — Care Management Note (Signed)
  Page 1 of 1   05/25/2013     3:15:18 PM   CARE MANAGEMENT NOTE 05/25/2013  Patient:  [name] [name], [name] [name]   Account Number:  [id number]  Date Initiated:  05/25/2013  Documentation initiated by:  Ronny Flurry  Subjective/Objective Assessment:     Action/Plan:   Anticipated DC Date:  05/25/2013   Anticipated DC Plan:  HOME W HOME HEALTH SERVICES         Choice offered to / List presented to:  C-1 Patient   DME arranged  3-N-1  Levan Hurst      DME agency  [facility]     HH arranged  HH-2 PT      [facility] agency  [facility]   Status of service:  Completed, signed off Medicare Important Message given?   (If response is "NO", the following Medicare IM given date fields will be blank) Date Medicare IM given:   Date Additional Medicare IM given:    Discharge Disposition:    Per UR Regulation:    If discussed at Long Length of Stay Meetings, dates discussed:    Comments:

## 2013-05-25 NOTE — Progress Notes (Signed)
Pt discharged to home

## 2013-05-26 ENCOUNTER — Encounter (HOSPITAL_COMMUNITY): Payer: Self-pay | Admitting: Orthopedic Surgery

## 2013-08-21 ENCOUNTER — Ambulatory Visit (INDEPENDENT_AMBULATORY_CARE_PROVIDER_SITE_OTHER): Payer: Medicaid Other | Admitting: *Deleted

## 2013-08-21 DIAGNOSIS — Z3049 Encounter for surveillance of other contraceptives: Secondary | ICD-10-CM

## 2013-08-21 DIAGNOSIS — Z3042 Encounter for surveillance of injectable contraceptive: Secondary | ICD-10-CM

## 2013-08-21 MED ORDER — MEDROXYPROGESTERONE ACETATE 150 MG/ML IM SUSP
150.0000 mg | Freq: Once | INTRAMUSCULAR | Status: AC
  Administered 2013-08-21: 150 mg via INTRAMUSCULAR

## 2013-08-27 ENCOUNTER — Other Ambulatory Visit: Payer: Medicaid Other | Admitting: Physician Assistant

## 2013-09-03 ENCOUNTER — Ambulatory Visit (INDEPENDENT_AMBULATORY_CARE_PROVIDER_SITE_OTHER): Payer: Medicaid Other | Admitting: Physician Assistant

## 2013-09-03 ENCOUNTER — Encounter: Payer: Self-pay | Admitting: Physician Assistant

## 2013-09-03 ENCOUNTER — Other Ambulatory Visit: Payer: Self-pay | Admitting: Physician Assistant

## 2013-09-03 VITALS — BP 110/64 | HR 80 | Temp 99.1°F | Resp 20 | Ht 63.0 in | Wt 206.0 lb

## 2013-09-03 DIAGNOSIS — B9689 Other specified bacterial agents as the cause of diseases classified elsewhere: Secondary | ICD-10-CM

## 2013-09-03 DIAGNOSIS — Z7251 High risk heterosexual behavior: Secondary | ICD-10-CM

## 2013-09-03 DIAGNOSIS — A499 Bacterial infection, unspecified: Secondary | ICD-10-CM

## 2013-09-03 DIAGNOSIS — N760 Acute vaginitis: Secondary | ICD-10-CM

## 2013-09-03 LAB — WET PREP FOR TRICH, YEAST, CLUE

## 2013-09-03 MED ORDER — METRONIDAZOLE 500 MG PO TABS: 500.0000 mg | ORAL_TABLET | Freq: Two times a day (BID) | ORAL | Status: DC

## 2013-09-03 NOTE — Progress Notes (Signed)
   Patient ID: Loreda Silverio MRN: [id number], DOB: [birth year], 22 y.o. Date of Encounter: 09/03/2013, 3:41 PM    Chief Complaint:  Chief Complaint  Patient presents with  . STD check     HPI: 22 y.o. year old AA female is here to have STD check. Says that she had unprotected sexual intercourse and just wants to be checked. She has no other complaints. She has been getting her Depo-Provera injections routinely for contraception.     Home Meds: See attached medication section for any medications that were entered at today's visit. The computer does not put those onto this list.The following list is a list of meds entered prior to today's visit.   Current Outpatient Prescriptions on File Prior to Visit  Medication Sig Dispense Refill  . medroxyPROGESTERone (DEPO-SUBQ PROVERA) 104 MG/0.65ML injection Inject 104 mg into the skin every 3 (three) months.       No current facility-administered medications on file prior to visit.    Allergies: No Known Allergies    Review of Systems: See HPI for pertinent ROS. All other ROS negative.    Physical Exam: Blood pressure 110/64, pulse 80, temperature 99.1 F (37.3 C), temperature source Oral, resp. rate 20, height 5\' 3"  (1.6 m), weight 206 lb (93.441 kg)., Body mass index is 36.5 kg/(m^2). General: Overweight African American female.  Appears in no acute distress. Lungs: Clear bilaterally to auscultation without wheezes, rales, or rhonchi. Breathing is unlabored. Heart: Regular rhythm. No murmurs, rubs, or gallops. Abdomen: Soft, non-tender, non-distended with normoactive bowel sounds. No hepatomegaly. No rebound/guarding. No obvious abdominal masses. Msk:  Strength and tone normal for age. Pelvic exam: External genitalia are normal. I see no lesions. Vaginal mucosa is normal. Cervix appears normal. There is moderate amount of liquidy white discharge. Bimanual exam is normal with no cervical motion tenderness. Neuro: Alert and  oriented X 3. Moves all extremities spontaneously. Gait is normal. CNII-XII grossly in tact. Psych:  Responds to questions appropriately with a normal affect.     ASSESSMENT AND PLAN:  22 y.o. year old female with  1. High risk sexual behavior I discussed that in order to check for all sexually transmitted diseases we would also have to do lab work. She defers this. She only wants to check for vaginal infections. She has been educated regarding use of condoms etc. for " safe sex practices". She is on Depo-Provera routinely for contraception.  - GC/chlamydia probe amp, genital - WET PREP FOR TRICH, YEAST, CLUE  2. Bacterial vaginosis - metroNIDAZOLE (FLAGYL) 500 MG tablet; Take 1 tablet (500 mg total) by mouth 2 (two) times daily.  Dispense: 14 tablet; Refill: 0   Signed, [address] [place], [place], [facility] 09/03/2013 3:41 PM

## 2013-09-04 LAB — GC/CHLAMYDIA PROBE AMP: CT Probe RNA: NEGATIVE

## 2013-09-08 ENCOUNTER — Encounter: Payer: Self-pay | Admitting: Family Medicine

## 2013-09-08 ENCOUNTER — Telehealth: Payer: Self-pay | Admitting: Family Medicine

## 2013-09-08 NOTE — Telephone Encounter (Signed)
Message copied by Donne Anon on Tue Sep 08, 2013  4:30 PM ------      Message from: Allayne Butcher      Created: Mon Sep 07, 2013  4:41 PM       She was treated for BV at her office visit. Tell her that the gonorrhea Chlamydia test are negative. ------

## 2013-09-08 NOTE — Telephone Encounter (Signed)
Aware of negative lab results

## 2013-09-09 DIAGNOSIS — T85848A Pain due to other internal prosthetic devices, implants and grafts, initial encounter: Secondary | ICD-10-CM

## 2013-09-09 HISTORY — DX: Pain due to other internal prosthetic devices, implants and grafts, initial encounter: T85.848A

## 2013-09-16 ENCOUNTER — Telehealth: Payer: Self-pay | Admitting: Physician Assistant

## 2013-09-23 ENCOUNTER — Encounter (HOSPITAL_BASED_OUTPATIENT_CLINIC_OR_DEPARTMENT_OTHER): Payer: Self-pay | Admitting: *Deleted

## 2013-09-30 ENCOUNTER — Other Ambulatory Visit: Payer: Self-pay | Admitting: Orthopedic Surgery

## 2013-09-30 ENCOUNTER — Encounter (HOSPITAL_BASED_OUTPATIENT_CLINIC_OR_DEPARTMENT_OTHER): Payer: Self-pay | Admitting: *Deleted

## 2013-10-01 ENCOUNTER — Ambulatory Visit (HOSPITAL_BASED_OUTPATIENT_CLINIC_OR_DEPARTMENT_OTHER): Payer: Medicaid Other | Admitting: Certified Registered Nurse Anesthetist

## 2013-10-01 ENCOUNTER — Encounter (HOSPITAL_BASED_OUTPATIENT_CLINIC_OR_DEPARTMENT_OTHER): Payer: Self-pay | Admitting: Certified Registered Nurse Anesthetist

## 2013-10-01 ENCOUNTER — Encounter (HOSPITAL_BASED_OUTPATIENT_CLINIC_OR_DEPARTMENT_OTHER)
Admission: RE | Disposition: A | Discharge status: Home / Self Care | Payer: Self-pay | Source: Ambulatory Visit | Attending: Orthopedic Surgery

## 2013-10-01 ENCOUNTER — Ambulatory Visit (HOSPITAL_BASED_OUTPATIENT_CLINIC_OR_DEPARTMENT_OTHER)
Admission: RE | Admit: 2013-10-01 | Discharge: 2013-10-01 | Disposition: A | Discharge status: Home / Self Care | Payer: Medicaid Other | Source: Ambulatory Visit | Attending: Orthopedic Surgery | Admitting: Orthopedic Surgery

## 2013-10-01 DIAGNOSIS — T8484XD Pain due to internal orthopedic prosthetic devices, implants and grafts, subsequent encounter: Secondary | ICD-10-CM

## 2013-10-01 DIAGNOSIS — Y831 Surgical operation with implant of artificial internal device as the cause of abnormal reaction of the patient, or of later complication, without mention of misadventure at the time of the procedure: Secondary | ICD-10-CM | POA: Insufficient documentation

## 2013-10-01 DIAGNOSIS — T8489XA Other specified complication of internal orthopedic prosthetic devices, implants and grafts, initial encounter: Secondary | ICD-10-CM | POA: Insufficient documentation

## 2013-10-01 DIAGNOSIS — M25579 Pain in unspecified ankle and joints of unspecified foot: Secondary | ICD-10-CM | POA: Insufficient documentation

## 2013-10-01 HISTORY — DX: Pain due to other internal prosthetic devices, implants and grafts, initial encounter: T85.848A

## 2013-10-01 HISTORY — PX: HARDWARE REMOVAL: SHX979

## 2013-10-01 HISTORY — DX: Other fracture of right lower leg, initial encounter for closed fracture: S82.891A

## 2013-10-01 LAB — POCT HEMOGLOBIN-HEMACUE: Hemoglobin: 11.8 g/dL — ABNORMAL LOW (ref 12.0–15.0)

## 2013-10-01 SURGERY — REMOVAL, HARDWARE
Anesthesia: General | Site: Ankle | Laterality: Right | Wound class: Clean

## 2013-10-01 MED ORDER — DEXAMETHASONE SODIUM PHOSPHATE 10 MG/ML IJ SOLN
INTRAMUSCULAR | Status: DC | PRN
  Administered 2013-10-01: 10 mg via INTRAVENOUS

## 2013-10-01 MED ORDER — BUPIVACAINE-EPINEPHRINE 0.5% -1:200000 IJ SOLN
INTRAMUSCULAR | Status: DC | PRN
  Administered 2013-10-01: 5 mL

## 2013-10-01 MED ORDER — CEFAZOLIN SODIUM-DEXTROSE 2-3 GM-% IV SOLR
INTRAVENOUS | Status: AC
  Filled 2013-10-01: qty 50

## 2013-10-01 MED ORDER — LIDOCAINE HCL (CARDIAC) 20 MG/ML IV SOLN
INTRAVENOUS | Status: DC | PRN
  Administered 2013-10-01: 30 mg via INTRAVENOUS

## 2013-10-01 MED ORDER — ONDANSETRON HCL 4 MG/2ML IJ SOLN: 4.0000 mg | Freq: Once | INTRAMUSCULAR | Status: DC | PRN

## 2013-10-01 MED ORDER — 0.9 % SODIUM CHLORIDE (POUR BTL) OPTIME
TOPICAL | Status: DC | PRN
  Administered 2013-10-01: 300 mL

## 2013-10-01 MED ORDER — FENTANYL CITRATE 0.05 MG/ML IJ SOLN: 50.0000 ug | INTRAMUSCULAR | Status: DC | PRN

## 2013-10-01 MED ORDER — CEFAZOLIN SODIUM-DEXTROSE 2-3 GM-% IV SOLR
2.0000 g | INTRAVENOUS | Status: AC
  Administered 2013-10-01: 2 g via INTRAVENOUS

## 2013-10-01 MED ORDER — OXYCODONE HCL 5 MG/5ML PO SOLN: 5.0000 mg | Freq: Once | ORAL | Status: DC | PRN

## 2013-10-01 MED ORDER — MIDAZOLAM HCL 2 MG/2ML IJ SOLN
INTRAMUSCULAR | Status: AC
  Filled 2013-10-01: qty 2

## 2013-10-01 MED ORDER — BACITRACIN ZINC 500 UNIT/GM EX OINT
TOPICAL_OINTMENT | CUTANEOUS | Status: DC | PRN
  Administered 2013-10-01: 1 via TOPICAL

## 2013-10-01 MED ORDER — ACETAMINOPHEN 500 MG PO TABS
ORAL_TABLET | ORAL | Status: AC
  Filled 2013-10-01: qty 2

## 2013-10-01 MED ORDER — PROPOFOL 10 MG/ML IV BOLUS
INTRAVENOUS | Status: DC | PRN
  Administered 2013-10-01: 260 mg via INTRAVENOUS

## 2013-10-01 MED ORDER — CHLORHEXIDINE GLUCONATE 4 % EX LIQD: 60.0000 mL | Freq: Once | CUTANEOUS | Status: DC

## 2013-10-01 MED ORDER — SODIUM CHLORIDE 0.9 % IV SOLN: INTRAVENOUS | Status: DC

## 2013-10-01 MED ORDER — OXYCODONE HCL 5 MG PO TABS
5.0000 mg | ORAL_TABLET | Freq: Once | ORAL | Status: DC | PRN
Start: 2013-10-01 — End: 2013-10-01

## 2013-10-01 MED ORDER — BACITRACIN ZINC 500 UNIT/GM EX OINT
TOPICAL_OINTMENT | CUTANEOUS | Status: AC
  Filled 2013-10-01: qty 28.35

## 2013-10-01 MED ORDER — LACTATED RINGERS IV SOLN
INTRAVENOUS | Status: DC
  Administered 2013-10-01 (×2): via INTRAVENOUS

## 2013-10-01 MED ORDER — FENTANYL CITRATE 0.05 MG/ML IJ SOLN
INTRAMUSCULAR | Status: DC | PRN
  Administered 2013-10-01 (×2): 50 ug via INTRAVENOUS

## 2013-10-01 MED ORDER — FENTANYL CITRATE 0.05 MG/ML IJ SOLN
INTRAMUSCULAR | Status: AC
  Filled 2013-10-01: qty 6

## 2013-10-01 MED ORDER — MIDAZOLAM HCL 5 MG/5ML IJ SOLN
INTRAMUSCULAR | Status: DC | PRN
  Administered 2013-10-01: 2 mg via INTRAVENOUS

## 2013-10-01 MED ORDER — PROPOFOL 10 MG/ML IV BOLUS
INTRAVENOUS | Status: AC
  Filled 2013-10-01: qty 20

## 2013-10-01 MED ORDER — HYDROMORPHONE HCL PF 1 MG/ML IJ SOLN: 0.2500 mg | INTRAMUSCULAR | Status: DC | PRN

## 2013-10-01 MED ORDER — ONDANSETRON HCL 4 MG/2ML IJ SOLN
INTRAMUSCULAR | Status: DC | PRN
  Administered 2013-10-01: 4 mg via INTRAVENOUS

## 2013-10-01 MED ORDER — MIDAZOLAM HCL 2 MG/2ML IJ SOLN: 0.5000 mg | INTRAMUSCULAR | Status: DC | PRN

## 2013-10-01 MED ORDER — ACETAMINOPHEN 500 MG PO TABS
1000.0000 mg | ORAL_TABLET | Freq: Once | ORAL | Status: AC
  Administered 2013-10-01: 1000 mg via ORAL

## 2013-10-01 SURGICAL SUPPLY — 73 items
BAG DECANTER FOR FLEXI CONT (MISCELLANEOUS) IMPLANT
BANDAGE ELASTIC 4 VELCRO ST LF (GAUZE/BANDAGES/DRESSINGS) IMPLANT
BANDAGE ESMARK 6X9 LF (GAUZE/BANDAGES/DRESSINGS) IMPLANT
BLADE SURG 15 STRL LF DISP TIS (BLADE) ×2 IMPLANT
BLADE SURG 15 STRL SS (BLADE) ×4
BNDG CMPR 9X4 STRL LF SNTH (GAUZE/BANDAGES/DRESSINGS)
BNDG CMPR 9X6 STRL LF SNTH (GAUZE/BANDAGES/DRESSINGS) ×1
BNDG COHESIVE 4X5 TAN STRL (GAUZE/BANDAGES/DRESSINGS) ×1 IMPLANT
BNDG COHESIVE 6X5 TAN STRL LF (GAUZE/BANDAGES/DRESSINGS) IMPLANT
BNDG ESMARK 4X9 LF (GAUZE/BANDAGES/DRESSINGS) IMPLANT
BNDG ESMARK 6X9 LF (GAUZE/BANDAGES/DRESSINGS) ×2
CHLORAPREP W/TINT 26ML (MISCELLANEOUS) ×2 IMPLANT
COVER TABLE BACK 60X90 (DRAPES) ×2 IMPLANT
CUFF TOURNIQUET SINGLE 34IN LL (TOURNIQUET CUFF) IMPLANT
DECANTER SPIKE VIAL GLASS SM (MISCELLANEOUS) IMPLANT
DRAPE EXTREMITY T 121X128X90 (DRAPE) ×2 IMPLANT
DRAPE OEC MINIVIEW 54X84 (DRAPES) ×1 IMPLANT
DRAPE SURG 17X23 STRL (DRAPES) IMPLANT
DRAPE U-SHAPE 47X51 STRL (DRAPES) ×2 IMPLANT
DRSG EMULSION OIL 3X3 NADH (GAUZE/BANDAGES/DRESSINGS) ×2 IMPLANT
DRSG PAD ABDOMINAL 8X10 ST (GAUZE/BANDAGES/DRESSINGS) IMPLANT
DRSG TEGADERM 4X4.75 (GAUZE/BANDAGES/DRESSINGS) IMPLANT
ELECT REM PT RETURN 9FT ADLT (ELECTROSURGICAL) ×2
ELECTRODE REM PT RTRN 9FT ADLT (ELECTROSURGICAL) ×1 IMPLANT
GLOVE BIO SURGEON STRL SZ8 (GLOVE) ×2 IMPLANT
GLOVE BIOGEL PI IND STRL 7.0 (GLOVE) IMPLANT
GLOVE BIOGEL PI IND STRL 7.5 (GLOVE) ×1 IMPLANT
GLOVE BIOGEL PI IND STRL 8 (GLOVE) ×1 IMPLANT
GLOVE BIOGEL PI INDICATOR 7.0 (GLOVE) ×2
GLOVE BIOGEL PI INDICATOR 7.5 (GLOVE) ×1
GLOVE BIOGEL PI INDICATOR 8 (GLOVE) ×1
GLOVE ECLIPSE 6.5 STRL STRAW (GLOVE) ×2 IMPLANT
GLOVE ECLIPSE 7.0 STRL STRAW (GLOVE) ×2 IMPLANT
GLOVE EXAM NITRILE MD LF STRL (GLOVE) ×1 IMPLANT
GOWN PREVENTION PLUS XLARGE (GOWN DISPOSABLE) ×5 IMPLANT
GOWN PREVENTION PLUS XXLARGE (GOWN DISPOSABLE) ×2 IMPLANT
NEEDLE HYPO 22GX1.5 SAFETY (NEEDLE) IMPLANT
PACK BASIN DAY SURGERY FS (CUSTOM PROCEDURE TRAY) ×2 IMPLANT
PAD CAST 4YDX4 CTTN HI CHSV (CAST SUPPLIES) ×1 IMPLANT
PADDING CAST ABS 4INX4YD NS (CAST SUPPLIES)
PADDING CAST ABS COTTON 4X4 ST (CAST SUPPLIES) IMPLANT
PADDING CAST COTTON 4X4 STRL (CAST SUPPLIES) ×2
PADDING CAST COTTON 6X4 STRL (CAST SUPPLIES) IMPLANT
PENCIL BUTTON HOLSTER BLD 10FT (ELECTRODE) ×1 IMPLANT
SANITIZER HAND PURELL 535ML FO (MISCELLANEOUS) ×2 IMPLANT
SHEET MEDIUM DRAPE 40X70 STRL (DRAPES) ×2 IMPLANT
SLEEVE SCD COMPRESS KNEE MED (MISCELLANEOUS) ×1 IMPLANT
SPLINT FAST PLASTER 5X30 (CAST SUPPLIES)
SPLINT PLASTER CAST FAST 5X30 (CAST SUPPLIES) IMPLANT
SPONGE GAUZE 4X4 12PLY (GAUZE/BANDAGES/DRESSINGS) ×2 IMPLANT
SPONGE LAP 18X18 X RAY DECT (DISPOSABLE) ×2 IMPLANT
STAPLER VISISTAT 35W (STAPLE) IMPLANT
STOCKINETTE 6  STRL (DRAPES) ×1
STOCKINETTE 6 STRL (DRAPES) ×1 IMPLANT
STRIP CLOSURE SKIN 1/2X4 (GAUZE/BANDAGES/DRESSINGS) IMPLANT
SUCTION FRAZIER TIP 10 FR DISP (SUCTIONS) IMPLANT
SUT ETHIBOND 0 MO6 C/R (SUTURE) IMPLANT
SUT ETHIBOND 2 OS 4 DA (SUTURE) IMPLANT
SUT ETHILON 3 0 PS 1 (SUTURE) IMPLANT
SUT MNCRL AB 3-0 PS2 18 (SUTURE) ×1 IMPLANT
SUT MNCRL AB 4-0 PS2 18 (SUTURE) IMPLANT
SUT VIC AB 0 CT1 27 (SUTURE)
SUT VIC AB 0 CT1 27XBRD ANBCTR (SUTURE) IMPLANT
SUT VIC AB 2-0 SH 18 (SUTURE) IMPLANT
SUT VIC AB 2-0 SH 27 (SUTURE)
SUT VIC AB 2-0 SH 27XBRD (SUTURE) IMPLANT
SUT VICRYL 4-0 PS2 18IN ABS (SUTURE) IMPLANT
SYR BULB 3OZ (MISCELLANEOUS) ×2 IMPLANT
SYR CONTROL 10ML LL (SYRINGE) ×1 IMPLANT
TOWEL OR 17X24 6PK STRL BLUE (TOWEL DISPOSABLE) ×2 IMPLANT
TOWEL OR NON WOVEN STRL DISP B (DISPOSABLE) ×2 IMPLANT
TUBE CONNECTING 20X1/4 (TUBING) IMPLANT
UNDERPAD 30X30 INCONTINENT (UNDERPADS AND DIAPERS) ×2 IMPLANT
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
## 2013-10-01 NOTE — Anesthesia Procedure Notes (Signed)
Procedure Name: LMA Insertion Date/Time: 10/01/2013 11:47 AM Performed by: Toni Arthurs Pre-anesthesia Checklist: Patient identified, Emergency Drugs available, Suction available and Patient being monitored Patient Re-evaluated:Patient Re-evaluated prior to inductionOxygen Delivery Method: Circle System Utilized Preoxygenation: Pre-oxygenation with 100% oxygen Intubation Type: IV induction Ventilation: Mask ventilation without difficulty LMA: LMA inserted LMA Size: 4.0 Number of attempts: 1 Airway Equipment and Method: bite block Placement Confirmation: positive ETCO2 Tube secured with: Tape Dental Injury: Teeth and Oropharynx as per pre-operative assessment

## 2013-10-01 NOTE — Anesthesia Preprocedure Evaluation (Addendum)
Anesthesia Evaluation  Patient identified by MRN, date of birth, ID band Patient awake    Reviewed: Allergy & Precautions, NPO status   Airway Mallampati: I TM Distance: >3 FB Neck ROM: Full    Dental  (+) Teeth Intact and Dental Advisory Given   Pulmonary  breath sounds clear to auscultation        Cardiovascular Rhythm:Regular Rate:Normal     Neuro/Psych    GI/Hepatic   Endo/Other  Morbid obesity  Renal/GU      Musculoskeletal   Abdominal   Peds  Hematology   Anesthesia Other Findings   Reproductive/Obstetrics                          Anesthesia Physical Anesthesia Plan  ASA: II  Anesthesia Plan: General   Post-op Pain Management:    Induction: Intravenous  Airway Management Planned: LMA  Additional Equipment:   Intra-op Plan:   Post-operative Plan: Extubation in OR  Informed Consent: I have reviewed the patients History and Physical, chart, labs and discussed the procedure including the risks, benefits and alternatives for the proposed anesthesia with the patient or authorized representative who has indicated his/her understanding and acceptance.   Dental advisory given  Plan Discussed with: CRNA, Anesthesiologist and Surgeon  Anesthesia Plan Comments:         Anesthesia Quick Evaluation
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
## 2013-10-01 NOTE — Brief Op Note (Signed)
10/01/2013  12:01 PM  PATIENT:  [name] [name]  22 y.o. female  PRE-OPERATIVE DIAGNOSIS:  RIGHT ANKLE PAINFUL HARDWARE   POST-OPERATIVE DIAGNOSIS:  same  Procedure(s): REMOVAL DEEP IMPLANT RIGHT ANKLE  AP and lateral radiographs of the right ankle  SURGEON:  Toni Arthurs, MD  ASSISTANT: n/a  ANESTHESIA:   General  EBL:  minimal   TOURNIQUET:   Total Tourniquet Time Documented: Leg (Right) - 4 minutes Total: Leg (Right) - 4 minutes   COMPLICATIONS:  None apparent  DISPOSITION:  Extubated, awake and stable to recovery.  DICTATION ID:  [id number]

## 2013-10-01 NOTE — Transfer of Care (Signed)
Immediate Anesthesia Transfer of Care Note  Patient: [name] [name]  Procedure(s) Performed: Procedure(s): REMOVAL DEEP IMPLANT RIGHT ANKLE  (Right)  Patient Location: PACU  Anesthesia Type:General  Level of Consciousness: sedated  Airway & Oxygen Therapy: Patient Spontanous Breathing and Patient connected to face mask oxygen  Post-op Assessment: Report given to PACU RN and Post -op Vital signs reviewed and stable  Post vital signs: Reviewed and stable  Complications: No apparent anesthesia complications

## 2013-10-01 NOTE — Anesthesia Postprocedure Evaluation (Signed)
  Anesthesia Post-op Note  Patient: [name] [name]  Procedure(s) Performed: Procedure(s): REMOVAL DEEP IMPLANT RIGHT ANKLE  (Right)  Patient Location: PACU  Anesthesia Type:General  Level of Consciousness: awake, alert  and oriented  Airway and Oxygen Therapy: Patient Spontanous Breathing  Post-op Pain: mild  Post-op Assessment: Post-op Vital signs reviewed  Post-op Vital Signs: Reviewed  Complications: No apparent anesthesia complications

## 2013-10-05 ENCOUNTER — Encounter (HOSPITAL_BASED_OUTPATIENT_CLINIC_OR_DEPARTMENT_OTHER): Payer: Self-pay | Admitting: Orthopedic Surgery

## 2013-11-18 ENCOUNTER — Other Ambulatory Visit: Payer: Self-pay | Admitting: Physician Assistant

## 2013-11-18 ENCOUNTER — Telehealth: Payer: Self-pay | Admitting: Physician Assistant

## 2013-11-18 NOTE — Telephone Encounter (Signed)
Needs Depo Provea called in to [facility]

## 2013-11-18 NOTE — Telephone Encounter (Signed)
Last PAP 08/2012  Last OV with pelvic exam 09/03/13  OK refill?

## 2013-11-18 NOTE — Telephone Encounter (Signed)
Refill sent.

## 2013-11-18 NOTE — Telephone Encounter (Signed)
I reviewed chart also. Pap smear was normal 08/2102. Pelvic exam was normal 08/2013.  Can refill Depo to last until 08/2014.  Will be due for OV/exam 08/2014.

## 2013-11-18 NOTE — Telephone Encounter (Signed)
Duplicate message. 

## 2014-05-11 ENCOUNTER — Other Ambulatory Visit: Payer: Medicaid Other | Admitting: Family Medicine

## 2014-05-26 ENCOUNTER — Other Ambulatory Visit: Payer: Medicaid Other | Admitting: Physician Assistant

## 2014-06-03 ENCOUNTER — Encounter: Payer: Self-pay | Admitting: Physician Assistant

## 2014-06-03 ENCOUNTER — Ambulatory Visit (INDEPENDENT_AMBULATORY_CARE_PROVIDER_SITE_OTHER): Payer: Medicaid Other | Admitting: Physician Assistant

## 2014-06-03 VITALS — BP 118/74 | HR 80 | Temp 99.1°F | Resp 18 | Ht 63.0 in | Wt 198.0 lb

## 2014-06-03 DIAGNOSIS — Z7251 High risk heterosexual behavior: Secondary | ICD-10-CM

## 2014-06-03 DIAGNOSIS — Z1239 Encounter for other screening for malignant neoplasm of breast: Secondary | ICD-10-CM

## 2014-06-03 DIAGNOSIS — Z3042 Encounter for surveillance of injectable contraceptive: Secondary | ICD-10-CM

## 2014-06-03 DIAGNOSIS — Z3049 Encounter for surveillance of other contraceptives: Secondary | ICD-10-CM

## 2014-06-03 DIAGNOSIS — Z124 Encounter for screening for malignant neoplasm of cervix: Secondary | ICD-10-CM

## 2014-06-03 DIAGNOSIS — Z113 Encounter for screening for infections with a predominantly sexual mode of transmission: Secondary | ICD-10-CM

## 2014-06-03 DIAGNOSIS — Z01419 Encounter for gynecological examination (general) (routine) without abnormal findings: Secondary | ICD-10-CM

## 2014-06-03 DIAGNOSIS — Z23 Encounter for immunization: Secondary | ICD-10-CM

## 2014-06-03 DIAGNOSIS — F17200 Nicotine dependence, unspecified, uncomplicated: Secondary | ICD-10-CM

## 2014-06-03 LAB — RPR

## 2014-06-03 LAB — HEPATITIS PANEL, ACUTE
HCV Ab: NEGATIVE
HEP B C IGM: NONREACTIVE
HEP B S AG: NEGATIVE
Hep A IgM: NONREACTIVE

## 2014-06-03 LAB — HIV ANTIBODY (ROUTINE TESTING W REFLEX): HIV 1&2 Ab, 4th Generation: NONREACTIVE

## 2014-06-03 LAB — HCG, SERUM, QUALITATIVE: PREG SERUM: NEGATIVE

## 2014-06-03 MED ORDER — MEDROXYPROGESTERONE ACETATE 150 MG/ML IM SUSP: 150.0000 mg | Freq: Once | INTRAMUSCULAR | Status: DC

## 2014-06-03 NOTE — Addendum Note (Signed)
Addended by: Olena Mater on: 06/03/2014 09:32 AM   Modules accepted: Orders

## 2014-06-03 NOTE — Addendum Note (Signed)
Addended by: Sharmon Revere on: 06/03/2014 09:34 AM   Modules accepted: Orders
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
## 2014-06-03 NOTE — Addendum Note (Signed)
Addended by: Sharmon Revere on: 06/03/2014 09:39 AM   Modules accepted: Orders

## 2014-06-03 NOTE — Addendum Note (Signed)
Addended by: Olena Mater on: 06/03/2014 09:57 AM   Modules accepted: Orders

## 2014-06-04 ENCOUNTER — Ambulatory Visit (INDEPENDENT_AMBULATORY_CARE_PROVIDER_SITE_OTHER): Payer: Medicaid Other | Admitting: Family Medicine

## 2014-06-04 DIAGNOSIS — Z3042 Encounter for surveillance of injectable contraceptive: Secondary | ICD-10-CM

## 2014-06-04 DIAGNOSIS — Z3049 Encounter for surveillance of other contraceptives: Secondary | ICD-10-CM

## 2014-06-04 LAB — HSV(HERPES SMPLX)ABS-I+II(IGG+IGM)-BLD
HSV 1 Glycoprotein G Ab, IgG: 9.63 IV — ABNORMAL HIGH
HSV 2 Glycoprotein G Ab, IgG: 0.83 IV
Herpes Simplex Vrs I&II-IgM Ab (EIA): 0.85 INDEX

## 2014-06-04 MED ORDER — MEDROXYPROGESTERONE ACETATE 150 MG/ML IM SUSP
150.0000 mg | Freq: Once | INTRAMUSCULAR | Status: AC
  Administered 2014-06-04: 150 mg via INTRAMUSCULAR

## 2014-06-04 NOTE — Progress Notes (Signed)
Patient ID: [name] [name], female   DOB: 1991-02-07, 23 y.o.   MRN: [id number] Patient came for Depo-provera injection.  Serum pregnancy test was performed prior to injection and result was negative.  Injection given without difficulty.

## 2014-06-07 LAB — PAP, THINPREP RFLX HPV WITH CT/GC
Chlamydia Probe Amp: NEGATIVE
GC Probe Amp: NEGATIVE

## 2014-06-09 LAB — HUMAN PAPILLOMAVIRUS, HIGH RISK: HPV DNA High Risk: DETECTED — AB

## 2014-06-14 ENCOUNTER — Other Ambulatory Visit: Payer: Self-pay | Admitting: Family Medicine

## 2014-06-14 DIAGNOSIS — IMO0002 Reserved for concepts with insufficient information to code with codable children: Secondary | ICD-10-CM

## 2014-07-22 ENCOUNTER — Encounter: Payer: Medicaid Other | Admitting: Family Medicine

## 2014-08-18 ENCOUNTER — Encounter: Payer: Self-pay | Admitting: *Deleted

## 2014-09-03 ENCOUNTER — Ambulatory Visit (INDEPENDENT_AMBULATORY_CARE_PROVIDER_SITE_OTHER): Payer: Self-pay | Admitting: *Deleted

## 2014-09-03 DIAGNOSIS — Z3049 Encounter for surveillance of other contraceptives: Secondary | ICD-10-CM

## 2014-09-03 DIAGNOSIS — Z308 Encounter for other contraceptive management: Secondary | ICD-10-CM

## 2014-09-03 MED ORDER — MEDROXYPROGESTERONE ACETATE 150 MG/ML IM SUSP
150.0000 mg | Freq: Once | INTRAMUSCULAR | Status: AC
  Administered 2014-09-03: 150 mg via INTRAMUSCULAR

## 2015-01-03 ENCOUNTER — Encounter: Payer: Self-pay | Admitting: Family Medicine

## 2015-01-23 ENCOUNTER — Other Ambulatory Visit: Payer: Self-pay | Admitting: Physician Assistant

## 2015-01-25 ENCOUNTER — Other Ambulatory Visit: Payer: Self-pay | Admitting: Family Medicine

## 2015-01-25 NOTE — Telephone Encounter (Signed)
Pt has not been seen in awhile.  One refill given and told her NTBS before any more refills.

## 2015-01-25 NOTE — Telephone Encounter (Signed)
Error,  Duplicate request

## 2015-02-09 ENCOUNTER — Encounter: Payer: Self-pay | Admitting: Family Medicine

## 2015-11-02 ENCOUNTER — Inpatient Hospital Stay (HOSPITAL_COMMUNITY)
Admission: AD | Admit: 2015-11-02 | Discharge: 2015-11-02 | Disposition: A | Discharge status: Home / Self Care | Payer: Medicaid Other | Source: Ambulatory Visit | Attending: Family Medicine | Admitting: Family Medicine

## 2015-11-02 ENCOUNTER — Encounter (HOSPITAL_COMMUNITY): Payer: Self-pay | Admitting: Student

## 2015-11-02 DIAGNOSIS — Z3201 Encounter for pregnancy test, result positive: Secondary | ICD-10-CM | POA: Diagnosis not present

## 2015-11-02 DIAGNOSIS — Z3200 Encounter for pregnancy test, result unknown: Secondary | ICD-10-CM | POA: Diagnosis present

## 2015-11-02 DIAGNOSIS — N926 Irregular menstruation, unspecified: Secondary | ICD-10-CM

## 2015-11-02 LAB — POCT PREGNANCY, URINE: PREG TEST UR: POSITIVE — AB

## 2015-11-02 MED ORDER — PROMETHAZINE HCL 25 MG PO TABS: 25.0000 mg | ORAL_TABLET | Freq: Four times a day (QID) | ORAL | Status: DC | PRN
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
## 2015-11-02 NOTE — Discharge Instructions (Signed)
First Trimester of Pregnancy  The first trimester of pregnancy is from week 1 until the end of week 12 (months 1 through 3). A week after a sperm fertilizes an egg, the egg will implant on the wall of the uterus. This embryo will begin to develop into a baby. Genes from you and your partner are forming the baby. The female genes determine whether the baby is a boy or a girl. At 6-8 weeks, the eyes and face are formed, and the heartbeat can be seen on ultrasound. At the end of 12 weeks, all the baby's organs are formed.   Now that you are pregnant, you will want to do everything you can to have a healthy baby. Two of the most important things are to get good prenatal care and to follow your health care provider's instructions. Prenatal care is all the medical care you receive before the baby's birth. This care will help prevent, find, and treat any problems during the pregnancy and childbirth.  BODY CHANGES  Your body goes through many changes during pregnancy. The changes vary from woman to woman.   · You may gain or lose a couple of pounds at first.  · You may feel sick to your stomach (nauseous) and throw up (vomit). If the vomiting is uncontrollable, call your health care provider.  · You may tire easily.  · You may develop headaches that can be relieved by medicines approved by your health care provider.  · You may urinate more often. Painful urination may mean you have a bladder infection.  · You may develop heartburn as a result of your pregnancy.  · You may develop constipation because certain hormones are causing the muscles that push waste through your intestines to slow down.  · You may develop hemorrhoids or swollen, bulging veins (varicose veins).  · Your breasts may begin to grow larger and become tender. Your nipples may stick out more, and the tissue that surrounds them (areola) may become darker.  · Your gums may bleed and may be sensitive to brushing and flossing.   · Dark spots or blotches (chloasma, mask of pregnancy) may develop on your face. This will likely fade after the baby is born.  · Your menstrual periods will stop.  · You may have a loss of appetite.  · You may develop cravings for certain kinds of food.  · You may have changes in your emotions from day to day, such as being excited to be pregnant or being concerned that something may go wrong with the pregnancy and baby.  · You may have more vivid and strange dreams.  · You may have changes in your hair. These can include thickening of your hair, rapid growth, and changes in texture. Some women also have hair loss during or after pregnancy, or hair that feels dry or thin. Your hair will most likely return to normal after your baby is born.  WHAT TO EXPECT AT YOUR PRENATAL VISITS  During a routine prenatal visit:  · You will be weighed to make sure you and the baby are growing normally.  · Your blood pressure will be taken.  · Your abdomen will be measured to track your baby's growth.  · The fetal heartbeat will be listened to starting around week 10 or 12 of your pregnancy.  · Test results from any previous visits will be discussed.  Your health care provider may ask you:  · How you are feeling.  · If you   including cigarettes, chewing tobacco, and electronic cigarettes.  If you have any questions. Other tests that may be performed during your first trimester include:  Blood tests to find your blood type and to check for the presence of any previous infections. They will also be used to check for low iron levels (anemia) and Rh antibodies. Later in the pregnancy, blood tests for diabetes will be done along with other tests if problems develop.  Urine tests to check for infections, diabetes, or protein in the urine.  An ultrasound to confirm the  proper growth and development of the baby.  An amniocentesis to check for possible genetic problems.  Fetal screens for spina bifida and Down syndrome.  You may need other tests to make sure you and the baby are doing well.  HIV (human immunodeficiency virus) testing. Routine prenatal testing includes screening for HIV, unless you choose not to have this test. HOME CARE INSTRUCTIONS  Medicines  Follow your health care provider's instructions regarding medicine use. Specific medicines may be either safe or unsafe to take during pregnancy.  Take your prenatal vitamins as directed.  If you develop constipation, try taking a stool softener if your health care provider approves. Diet  Eat regular, well-balanced meals. Choose a variety of foods, such as meat or vegetable-based protein, fish, milk and low-fat dairy products, vegetables, fruits, and whole grain breads and cereals. Your health care provider will help you determine the amount of weight gain that is right for you.  Avoid raw meat and uncooked cheese. These carry germs that can cause birth defects in the baby.  Eating four or five small meals rather than three large meals a day may help relieve nausea and vomiting. If you start to feel nauseous, eating a few soda crackers can be helpful. Drinking liquids between meals instead of during meals also seems to help nausea and vomiting.  If you develop constipation, eat more high-fiber foods, such as fresh vegetables or fruit and whole grains. Drink enough fluids to keep your urine clear or pale yellow. Activity and Exercise  Exercise only as directed by your health care provider. Exercising will help you:  Control your weight.  Stay in shape.  Be prepared for labor and delivery.  Experiencing pain or cramping in the lower abdomen or low back is a good sign that you should stop exercising. Check with your health care provider before continuing normal exercises.  Try to avoid  standing for long periods of time. Move your legs often if you must stand in one place for a long time.  Avoid heavy lifting.  Wear low-heeled shoes, and practice good posture.  You may continue to have sex unless your health care provider directs you otherwise. Relief of Pain or Discomfort  Wear a good support bra for breast tenderness.   Take warm sitz baths to soothe any pain or discomfort caused by hemorrhoids. Use hemorrhoid cream if your health care provider approves.   Rest with your legs elevated if you have leg cramps or low back pain.  If you develop varicose veins in your legs, wear support hose. Elevate your feet for 15 minutes, 3-4 times a day. Limit salt in your diet. Prenatal Care  Schedule your prenatal visits by the twelfth week of pregnancy. They are usually scheduled monthly at first, then more often in the last 2 months before delivery.  Write down your questions. Take them to your prenatal visits.  Keep all your prenatal visits as directed by your   health care provider. Safety  Wear your seat belt at all times when driving.  Make a list of emergency phone numbers, including numbers for family, friends, the hospital, and police and fire departments. General Tips  Ask your health care provider for a referral to a local prenatal education class. Begin classes no later than at the beginning of month 6 of your pregnancy.  Ask for help if you have counseling or nutritional needs during pregnancy. Your health care provider can offer advice or refer you to specialists for help with various needs.  Do not use hot tubs, steam rooms, or saunas.  Do not douche or use tampons or scented sanitary pads.  Do not cross your legs for long periods of time.  Avoid cat litter boxes and soil used by cats. These carry germs that can cause birth defects in the baby and possibly loss of the fetus by miscarriage or stillbirth.  Avoid all smoking, herbs, alcohol, and medicines  not prescribed by your health care provider. Chemicals in these affect the formation and growth of the baby.  Do not use any tobacco products, including cigarettes, chewing tobacco, and electronic cigarettes. If you need help quitting, ask your health care provider. You may receive counseling support and other resources to help you quit.  Schedule a dentist appointment. At home, brush your teeth with a soft toothbrush and be gentle when you floss. SEEK MEDICAL CARE IF:   You have dizziness.  You have mild pelvic cramps, pelvic pressure, or nagging pain in the abdominal area.  You have persistent nausea, vomiting, or diarrhea.  You have a bad smelling vaginal discharge.  You have pain with urination.  You notice increased swelling in your face, hands, legs, or ankles. SEEK IMMEDIATE MEDICAL CARE IF:   You have a fever.  You are leaking fluid from your vagina.  You have spotting or bleeding from your vagina.  You have severe abdominal cramping or pain.  You have rapid weight gain or loss.  You vomit blood or material that looks like coffee grounds.  You are exposed to German measles and have never had them.  You are exposed to fifth disease or chickenpox.  You develop a severe headache.  You have shortness of breath.  You have any kind of trauma, such as from a fall or a car accident.   This information is not intended to replace advice given to you by your health care provider. Make sure you discuss any questions you have with your health care provider.   Document Released: 11/20/2001 Document Revised: 12/17/2014 Document Reviewed: 10/06/2013 Elsevier Interactive Patient Education 2016 [employer]  Safe Medications in Pregnancy   Acne: Benzoyl Peroxide Salicylic Acid  Backache/Headache: Tylenol: 2 regular strength every 4 hours OR              2 Extra strength every 6 hours  Colds/Coughs/Allergies: Benadryl (alcohol free) 25 mg every 6 hours as  needed Breath right strips Claritin Cepacol throat lozenges Chloraseptic throat spray Cold-Eeze- up to three times per day Cough drops, alcohol free Flonase (by prescription only) Guaifenesin Mucinex Robitussin DM (plain only, alcohol free) Saline nasal spray/drops Sudafed (pseudoephedrine) & Actifed ** use only after [redacted] weeks gestation and if you do not have high blood pressure Tylenol Vicks Vaporub Zinc lozenges Zyrtec   Constipation: Colace Ducolax suppositories Fleet enema Glycerin suppositories Metamucil Milk of magnesia Miralax Senokot Smooth move tea  Diarrhea: Kaopectate Imodium A-D  *NO pepto Bismol  Hemorrhoids: Anusol Anusol   HC Preparation H Tucks  Indigestion: Tums Maalox Mylanta Zantac  Pepcid  Insomnia: Benadryl (alcohol free) 25mg every 6 hours as needed Tylenol PM Unisom, no Gelcaps  Leg Cramps: Tums MagGel  Nausea/Vomiting:  Bonine Dramamine Emetrol Ginger extract Sea bands Meclizine  Nausea medication to take during pregnancy:  Unisom (doxylamine succinate 25 mg tablets) Take one tablet daily at bedtime. If symptoms are not adequately controlled, the dose can be increased to a maximum recommended dose of two tablets daily (1/2 tablet in the morning, 1/2 tablet mid-afternoon and one at bedtime). Vitamin B6 100mg tablets. Take one tablet twice a day (up to 200 mg per day).  Skin Rashes: Aveeno products Benadryl cream or 25mg every 6 hours as needed Calamine Lotion 1% cortisone cream  Yeast infection: Gyne-lotrimin 7 Monistat 7   **If taking multiple medications, please check labels to avoid duplicating the same active ingredients **take medication as directed on the label ** Do not exceed 4000 mg of tylenol in 24 hours **Do not take medications that contain aspirin or ibuprofen    

## 2015-11-02 NOTE — MAU Note (Signed)
Pt on depo last injection in Jan. Cycle started in  August , Had one in Sept and OCt but none in Nov. 2 Positive HPT. Just need confirm. Denies pain or bleeding has nausea on and off.

## 2015-12-11 NOTE — L&D Delivery Note (Addendum)
Delivery Note At 3:04 AM a viable female, "[name] [name]", was delivered via Vaginal, Spontaneous Delivery (Presentation: ; Occiput Anterior).  APGAR: 8, 10; weight  .   Placenta status: Intact, Spontaneous.  Cord: 3 vessels with the following complications: None.  Cord pH: NA  Anesthesia: Local  Episiotomy: None Lacerations: Left periurethral Suture Repair: 3.0 vicryl Est. 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  NT, no advancement during observation during repair. Ice pack applied to area.  RN will recheck area before transfer to [facility].  Mom to postpartum.  Baby to Couplet care / Skin to Skin. Family plans outpatient circumcision. Patient undecided regarding contraception. Plan SW consult for late pregnancy MJ use  [name] 06/15/2016, 4:10 AM  ADDENDUM: Recheck of small vaginal hematoma prior to transfer to [facility]--stable, no advancement in size, NT. Will continue to observe.  Donnel Saxon, CNM 06/15/16 0430

## 2015-12-30 LAB — OB RESULTS CONSOLE GC/CHLAMYDIA
Chlamydia: NEGATIVE
Gonorrhea: NEGATIVE

## 2015-12-30 LAB — OB RESULTS CONSOLE ABO/RH: RH Type: POSITIVE

## 2015-12-30 LAB — OB RESULTS CONSOLE ANTIBODY SCREEN: ANTIBODY SCREEN: NEGATIVE

## 2015-12-30 LAB — OB RESULTS CONSOLE RUBELLA ANTIBODY, IGM: RUBELLA: IMMUNE

## 2015-12-30 LAB — OB RESULTS CONSOLE HEPATITIS B SURFACE ANTIGEN: HEP B S AG: NEGATIVE

## 2015-12-30 LAB — OB RESULTS CONSOLE HIV ANTIBODY (ROUTINE TESTING): HIV: NONREACTIVE

## 2016-03-27 LAB — OB RESULTS CONSOLE RPR: RPR: NONREACTIVE

## 2016-06-04 LAB — OB RESULTS CONSOLE GBS: STREP GROUP B AG: POSITIVE

## 2016-06-14 ENCOUNTER — Inpatient Hospital Stay (HOSPITAL_COMMUNITY)
Admission: AD | Admit: 2016-06-14 | Discharge: 2016-06-17 | DRG: 775 | Disposition: A | Discharge status: Home / Self Care | Payer: Medicaid Other | Source: Ambulatory Visit | Attending: Obstetrics and Gynecology | Admitting: Obstetrics and Gynecology

## 2016-06-14 ENCOUNTER — Encounter (HOSPITAL_COMMUNITY): Payer: Self-pay | Admitting: *Deleted

## 2016-06-14 ENCOUNTER — Inpatient Hospital Stay (HOSPITAL_COMMUNITY): Payer: Medicaid Other

## 2016-06-14 DIAGNOSIS — O4290 Premature rupture of membranes, unspecified as to length of time between rupture and onset of labor, unspecified weeks of gestation: Secondary | ICD-10-CM | POA: Diagnosis present

## 2016-06-14 DIAGNOSIS — F1290 Cannabis use, unspecified, uncomplicated: Secondary | ICD-10-CM | POA: Diagnosis present

## 2016-06-14 DIAGNOSIS — D259 Leiomyoma of uterus, unspecified: Secondary | ICD-10-CM | POA: Diagnosis present

## 2016-06-14 DIAGNOSIS — O3413 Maternal care for benign tumor of corpus uteri, third trimester: Secondary | ICD-10-CM | POA: Diagnosis present

## 2016-06-14 DIAGNOSIS — O368130 Decreased fetal movements, third trimester, not applicable or unspecified: Secondary | ICD-10-CM | POA: Diagnosis present

## 2016-06-14 DIAGNOSIS — O4103X0 Oligohydramnios, third trimester, not applicable or unspecified: Principal | ICD-10-CM | POA: Diagnosis present

## 2016-06-14 DIAGNOSIS — Z8249 Family history of ischemic heart disease and other diseases of the circulatory system: Secondary | ICD-10-CM

## 2016-06-14 DIAGNOSIS — O99824 Streptococcus B carrier state complicating childbirth: Secondary | ICD-10-CM | POA: Diagnosis present

## 2016-06-14 DIAGNOSIS — Z833 Family history of diabetes mellitus: Secondary | ICD-10-CM | POA: Diagnosis not present

## 2016-06-14 DIAGNOSIS — O368390 Maternal care for abnormalities of the fetal heart rate or rhythm, unspecified trimester, not applicable or unspecified: Secondary | ICD-10-CM

## 2016-06-14 DIAGNOSIS — Z87891 Personal history of nicotine dependence: Secondary | ICD-10-CM

## 2016-06-14 DIAGNOSIS — B951 Streptococcus, group B, as the cause of diseases classified elsewhere: Secondary | ICD-10-CM | POA: Diagnosis present

## 2016-06-14 DIAGNOSIS — D649 Anemia, unspecified: Secondary | ICD-10-CM | POA: Diagnosis present

## 2016-06-14 DIAGNOSIS — Z3A38 38 weeks gestation of pregnancy: Secondary | ICD-10-CM

## 2016-06-14 DIAGNOSIS — O9081 Anemia of the puerperium: Secondary | ICD-10-CM | POA: Diagnosis present

## 2016-06-14 DIAGNOSIS — O99324 Drug use complicating childbirth: Secondary | ICD-10-CM | POA: Diagnosis present

## 2016-06-14 DIAGNOSIS — N898 Other specified noninflammatory disorders of vagina: Secondary | ICD-10-CM | POA: Diagnosis not present

## 2016-06-14 LAB — TYPE AND SCREEN
ABO/RH(D): A POS
Antibody Screen: NEGATIVE

## 2016-06-14 LAB — CBC
HCT: 31.5 % — ABNORMAL LOW (ref 36.0–46.0)
Hemoglobin: 10.5 g/dL — ABNORMAL LOW (ref 12.0–15.0)
MCH: 26.9 pg (ref 26.0–34.0)
MCHC: 33.3 g/dL (ref 30.0–36.0)
MCV: 80.8 fL (ref 78.0–100.0)
PLATELETS: 293 10*3/uL (ref 150–400)
RBC: 3.9 MIL/uL (ref 3.87–5.11)
RDW: 15.6 % — AB (ref 11.5–15.5)
WBC: 6.6 10*3/uL (ref 4.0–10.5)

## 2016-06-14 LAB — ABO/RH: ABO/RH(D): A POS

## 2016-06-14 MED ORDER — LACTATED RINGERS IV SOLN
500.0000 mL | INTRAVENOUS | Status: DC | PRN
  Administered 2016-06-14: 500 mL via INTRAVENOUS

## 2016-06-14 MED ORDER — LACTATED RINGERS IV SOLN
INTRAVENOUS | Status: DC
  Administered 2016-06-14: 20:00:00 via INTRAUTERINE

## 2016-06-14 MED ORDER — MISOPROSTOL 200 MCG PO TABS
50.0000 ug | ORAL_TABLET | ORAL | Status: DC | PRN
  Administered 2016-06-14: 50 ug via ORAL
  Filled 2016-06-14: qty 0.5

## 2016-06-14 MED ORDER — OXYTOCIN 40 UNITS IN LACTATED RINGERS INFUSION - SIMPLE MED
1.0000 m[IU]/min | INTRAVENOUS | Status: DC
  Administered 2016-06-14: 1 m[IU]/min via INTRAVENOUS
  Filled 2016-06-14: qty 1000

## 2016-06-14 MED ORDER — OXYCODONE-ACETAMINOPHEN 5-325 MG PO TABS: 1.0000 | ORAL_TABLET | ORAL | Status: DC | PRN

## 2016-06-14 MED ORDER — ONDANSETRON HCL 4 MG/2ML IJ SOLN: 4.0000 mg | Freq: Four times a day (QID) | INTRAMUSCULAR | Status: DC | PRN

## 2016-06-14 MED ORDER — OXYCODONE-ACETAMINOPHEN 5-325 MG PO TABS: 2.0000 | ORAL_TABLET | ORAL | Status: DC | PRN

## 2016-06-14 MED ORDER — TERBUTALINE SULFATE 1 MG/ML IJ SOLN: 0.2500 mg | Freq: Once | INTRAMUSCULAR | Status: DC | PRN

## 2016-06-14 MED ORDER — LIDOCAINE HCL (PF) 1 % IJ SOLN
30.0000 mL | INTRAMUSCULAR | Status: DC | PRN
  Administered 2016-06-15: 30 mL via SUBCUTANEOUS
  Filled 2016-06-14: qty 30

## 2016-06-14 MED ORDER — OXYTOCIN BOLUS FROM INFUSION
500.0000 mL | INTRAVENOUS | Status: DC
  Administered 2016-06-15: 500 mL via INTRAVENOUS

## 2016-06-14 MED ORDER — FENTANYL CITRATE (PF) 100 MCG/2ML IJ SOLN
100.0000 ug | INTRAMUSCULAR | Status: DC | PRN
  Administered 2016-06-15 (×2): 100 ug via INTRAVENOUS
  Filled 2016-06-14 (×2): qty 2

## 2016-06-14 MED ORDER — TERBUTALINE SULFATE 1 MG/ML IJ SOLN
0.2500 mg | Freq: Once | INTRAMUSCULAR | Status: DC | PRN
  Filled 2016-06-14: qty 1

## 2016-06-14 MED ORDER — OXYTOCIN 40 UNITS IN LACTATED RINGERS INFUSION - SIMPLE MED: 2.5000 [IU]/h | INTRAVENOUS | Status: DC

## 2016-06-14 MED ORDER — LACTATED RINGERS IV SOLN
INTRAVENOUS | Status: DC
  Administered 2016-06-14 – 2016-06-15 (×3): via INTRAVENOUS

## 2016-06-14 MED ORDER — SOD CITRATE-CITRIC ACID 500-334 MG/5ML PO SOLN: 30.0000 mL | ORAL | Status: DC | PRN

## 2016-06-14 MED ORDER — ACETAMINOPHEN 325 MG PO TABS: 650.0000 mg | ORAL_TABLET | ORAL | Status: DC | PRN

## 2016-06-14 MED ORDER — PENICILLIN G POTASSIUM 5000000 UNITS IJ SOLR
2.5000 10*6.[IU] | INTRAMUSCULAR | Status: DC
  Administered 2016-06-15: 2.5 10*6.[IU] via INTRAVENOUS
  Filled 2016-06-14 (×4): qty 2.5

## 2016-06-14 MED ORDER — ZOLPIDEM TARTRATE 5 MG PO TABS: 5.0000 mg | ORAL_TABLET | Freq: Every evening | ORAL | Status: DC | PRN

## 2016-06-14 MED ORDER — PENICILLIN G POTASSIUM 5000000 UNITS IJ SOLR
5.0000 10*6.[IU] | Freq: Once | INTRAVENOUS | Status: AC
  Administered 2016-06-14: 5 10*6.[IU] via INTRAVENOUS
  Filled 2016-06-14: qty 5
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
## 2016-06-14 NOTE — Anesthesia Pain Management Evaluation Note (Signed)
  CRNA Pain Management Visit Note  Patient: [name] [name], 25 y.o., female  "Hello I am a member of the anesthesia team at [facility]. We have an anesthesia team available at all times to provide care throughout the hospital, including epidural management and anesthesia for C-section. I don't know your plan for the delivery whether it a natural birth, water birth, IV sedation, nitrous supplementation, doula or epidural, but we want to meet your pain goals."   1.Was your pain managed to your expectations on prior hospitalizations?   No prior hospitalizations  2.What is your expectation for pain management during this hospitalization?     Epidural  3.How can we help you reach that goal? epidural  Record the patient's initial score and the patient's pain goal.   Pain: 4  Pain Goal: 10 [facility] wants you to be able to say your pain was always managed very well.  Rik Wadel 06/14/2016

## 2016-06-14 NOTE — Progress Notes (Addendum)
  Subjective: Uncomfortable with some UCs.  Mother and other family at bedside.  Objective: BP 134/77 mmHg  Pulse 66  Temp(Src) 99.2 F (37.3 C) (Oral)  Resp 16  Ht 5\' 2"  (1.575 m)  Wt 97.07 kg (214 lb)  BMI 39.13 kg/m2  SpO2 100%  LMP 09/20/2015      FHT: Category 1 UC:   irregular, every 2-5 minutes SVE:  2 cm, 75%, vtx, -2 Leaking clear fluid  MVUs 140   Assessment:  Early labor PROM x 2 days GBS positive  Plan: Start pitocin augmentation Pain med prn.  Donnel Saxon CNM 06/14/2016, 11:31 PM

## 2016-06-14 NOTE — MAU Note (Signed)
Pt received to MAU rm 2 for extended fetal monitoring from doctor's office. EFM applied. FHR 135. Pt denies contractions, leaking fluid, or vaginal bleeding. Abdomen soft on palpation. Toya Smothers, RN

## 2016-06-14 NOTE — MAU Note (Signed)
Urine sent to lab @1235 .

## 2016-06-14 NOTE — Progress Notes (Signed)
  Subjective: Not aware of any UCs.  Aunt at bedside.  Objective: BP 134/82 mmHg  Pulse 86  Temp(Src) 98.3 F (36.8 C) (Oral)  Resp 18  Ht 5\' 2"  (1.575 m)  Wt 97.07 kg (214 lb)  BMI 39.13 kg/m2  SpO2 100%  LMP 09/20/2015      Filed Vitals:   06/14/16 1652 06/14/16 1706 06/14/16 1854 06/14/16 1905  BP: 132/92  134/82   Pulse: 75  79 86  Temp: 98.2 F (36.8 C)  98.3 F (36.8 C)   TempSrc: Oral  Oral   Resp: 16  18   Height:  5\' 2"  (1.575 m)    Weight:  97.07 kg (214 lb)    SpO2:    100%    FHT: Category 2--decel around 7pm x 10 min, with FHR to 70-110 bpm, moderate variability, recovered with position change and scalp stim.  Baseline 130-140 now, moderate variability, mild variable at 1724.  No recurrent decels. UC:   Difficult to assess by palpation SVE:   Dilation: 1.5 Effacement (%): 50 Station: -2 Exam by:: V Ac Colan CNM  FSE and IUPC placed without difficulty Received Cytotech 50 mcg po at 1843  Assessment:  IUP at 38 2/7 weeks SROM x 2 days GBS positive Small fibroids Hx smoking--stopped in 1st trimester Hx MJ use--stopped in 3rd trimester Small fibroids  Plan: Dr. Cletis Media updated, and she had informed on-coming MD, Dr. Simona Huh, of decel. Continue to observe FHR closely. No further Cytotech--will start pitocin as appropriate 4 hours after Cytotech dose. Pain med prn Amnioinfusion prn if variables persist. SW consult pp Watch BP--PIH w/u prn.  Donnel Saxon CNM 06/14/2016, 7:26 PM

## 2016-06-15 ENCOUNTER — Encounter (HOSPITAL_COMMUNITY): Payer: Self-pay

## 2016-06-15 DIAGNOSIS — B951 Streptococcus, group B, as the cause of diseases classified elsewhere: Secondary | ICD-10-CM | POA: Diagnosis present

## 2016-06-15 DIAGNOSIS — O4290 Premature rupture of membranes, unspecified as to length of time between rupture and onset of labor, unspecified weeks of gestation: Secondary | ICD-10-CM | POA: Diagnosis present

## 2016-06-15 DIAGNOSIS — N898 Other specified noninflammatory disorders of vagina: Secondary | ICD-10-CM | POA: Diagnosis not present

## 2016-06-15 LAB — RPR: RPR: NONREACTIVE

## 2016-06-15 MED ORDER — PHENYLEPHRINE 40 MCG/ML (10ML) SYRINGE FOR IV PUSH (FOR BLOOD PRESSURE SUPPORT)
80.0000 ug | PREFILLED_SYRINGE | INTRAVENOUS | Status: DC | PRN
  Filled 2016-06-15: qty 5

## 2016-06-15 MED ORDER — FENTANYL 2.5 MCG/ML BUPIVACAINE 1/10 % EPIDURAL INFUSION (WH - ANES)
INTRAMUSCULAR | Status: AC
  Filled 2016-06-15: qty 125

## 2016-06-15 MED ORDER — ONDANSETRON HCL 4 MG PO TABS: 4.0000 mg | ORAL_TABLET | ORAL | Status: DC | PRN

## 2016-06-15 MED ORDER — PNEUMOCOCCAL VAC POLYVALENT 25 MCG/0.5ML IJ INJ
0.5000 mL | INJECTION | INTRAMUSCULAR | Status: DC
  Filled 2016-06-15: qty 0.5

## 2016-06-15 MED ORDER — FENTANYL 2.5 MCG/ML BUPIVACAINE 1/10 % EPIDURAL INFUSION (WH - ANES): 14.0000 mL/h | INTRAMUSCULAR | Status: DC | PRN

## 2016-06-15 MED ORDER — OXYCODONE HCL 5 MG PO TABS: 10.0000 mg | ORAL_TABLET | ORAL | Status: DC | PRN

## 2016-06-15 MED ORDER — DIPHENHYDRAMINE HCL 50 MG/ML IJ SOLN: 12.5000 mg | INTRAMUSCULAR | Status: DC | PRN

## 2016-06-15 MED ORDER — ACETAMINOPHEN 325 MG PO TABS: 650.0000 mg | ORAL_TABLET | ORAL | Status: DC | PRN

## 2016-06-15 MED ORDER — ZOLPIDEM TARTRATE 5 MG PO TABS: 5.0000 mg | ORAL_TABLET | Freq: Every evening | ORAL | Status: DC | PRN

## 2016-06-15 MED ORDER — EPHEDRINE 5 MG/ML INJ
10.0000 mg | INTRAVENOUS | Status: DC | PRN
  Filled 2016-06-15: qty 2

## 2016-06-15 MED ORDER — PHENYLEPHRINE 40 MCG/ML (10ML) SYRINGE FOR IV PUSH (FOR BLOOD PRESSURE SUPPORT)
PREFILLED_SYRINGE | INTRAVENOUS | Status: AC
  Filled 2016-06-15: qty 20

## 2016-06-15 MED ORDER — DIBUCAINE 1 % RE OINT: 1.0000 "application " | TOPICAL_OINTMENT | RECTAL | Status: DC | PRN

## 2016-06-15 MED ORDER — TETANUS-DIPHTH-ACELL PERTUSSIS 5-2.5-18.5 LF-MCG/0.5 IM SUSP: 0.5000 mL | Freq: Once | INTRAMUSCULAR | Status: DC

## 2016-06-15 MED ORDER — COCONUT OIL OIL
1.0000 | TOPICAL_OIL | Status: DC | PRN
  Administered 2016-06-15: 1 via TOPICAL
  Filled 2016-06-15: qty 120

## 2016-06-15 MED ORDER — WITCH HAZEL-GLYCERIN EX PADS: 1.0000 "application " | MEDICATED_PAD | CUTANEOUS | Status: DC | PRN

## 2016-06-15 MED ORDER — ONDANSETRON HCL 4 MG/2ML IJ SOLN: 4.0000 mg | INTRAMUSCULAR | Status: DC | PRN

## 2016-06-15 MED ORDER — SENNOSIDES-DOCUSATE SODIUM 8.6-50 MG PO TABS
2.0000 | ORAL_TABLET | ORAL | Status: DC
  Administered 2016-06-16 – 2016-06-17 (×2): 2 via ORAL
  Filled 2016-06-15 (×2): qty 2

## 2016-06-15 MED ORDER — PRENATAL MULTIVITAMIN CH
1.0000 | ORAL_TABLET | Freq: Every day | ORAL | Status: DC
  Administered 2016-06-15 – 2016-06-17 (×3): 1 via ORAL
  Filled 2016-06-15 (×3): qty 1

## 2016-06-15 MED ORDER — FERROUS SULFATE 325 (65 FE) MG PO TABS
325.0000 mg | ORAL_TABLET | Freq: Every day | ORAL | Status: DC
  Administered 2016-06-15 – 2016-06-17 (×3): 325 mg via ORAL
  Filled 2016-06-15 (×3): qty 1

## 2016-06-15 MED ORDER — OXYCODONE HCL 5 MG PO TABS: 5.0000 mg | ORAL_TABLET | ORAL | Status: DC | PRN

## 2016-06-15 MED ORDER — LACTATED RINGERS IV SOLN: 500.0000 mL | Freq: Once | INTRAVENOUS | Status: DC

## 2016-06-15 MED ORDER — BENZOCAINE-MENTHOL 20-0.5 % EX AERO
1.0000 "application " | INHALATION_SPRAY | CUTANEOUS | Status: DC | PRN
  Administered 2016-06-15: 1 via TOPICAL
  Filled 2016-06-15: qty 56

## 2016-06-15 MED ORDER — IBUPROFEN 600 MG PO TABS
600.0000 mg | ORAL_TABLET | Freq: Four times a day (QID) | ORAL | Status: DC
  Administered 2016-06-15 – 2016-06-17 (×10): 600 mg via ORAL
  Filled 2016-06-15 (×10): qty 1

## 2016-06-15 MED ORDER — DIPHENHYDRAMINE HCL 25 MG PO CAPS: 25.0000 mg | ORAL_CAPSULE | Freq: Four times a day (QID) | ORAL | Status: DC | PRN

## 2016-06-15 MED ORDER — SIMETHICONE 80 MG PO CHEW: 80.0000 mg | CHEWABLE_TABLET | ORAL | Status: DC | PRN

## 2016-06-15 NOTE — Lactation Note (Signed)
This note was copied from a baby's chart. Lactation Consultation Note  RN requested assistance with latching baby before next serum blood sugar. Sherri Sear was sleepy and would not latch to the breast. He was spoon fed about 2 ml and started to act more hungry.  He again would not latch because he was sucking his tongue so a #24 NS was initiated.  He suckled briefly but mainly compressed it in his mouth.  An additional 2 ml of breast milk was expressed and syringe finger fed to him.  Mother was very sleepy and could not stay awake to hold flanges in place while pumped. Hand expression was reviewed and teaching on positioning initiated.  Because of her sleepy state she will need to have teaching reviewed.  Information given on support groups and outpatient services.  Patient Name: Boy Sandar Rodenbeck [id number] Date: 06/15/2016 Reason for consult: Infant weight loss   Maternal Data Has patient been taught Hand Expression?: Yes Does the patient have breastfeeding experience prior to this delivery?: No  Feeding Feeding Type: Breast Milk  LATCH Score/Interventions Latch: Too sleepy or reluctant, no latch achieved, no sucking elicited.  Audible Swallowing: None  Type of Nipple: Flat  Comfort (Breast/Nipple): Soft / non-tender     Hold (Positioning): Assistance needed to correctly position infant at breast and maintain latch.  LATCH Score: 4  Lactation Tools Discussed/Used Tools: Nipple Shields Nipple shield size: 24   Consult Status      Van Clines 06/15/2016, 1:09 PM

## 2016-06-16 LAB — CBC
HEMATOCRIT: 28.6 % — AB (ref 36.0–46.0)
HEMOGLOBIN: 9.4 g/dL — AB (ref 12.0–15.0)
MCH: 26.7 pg (ref 26.0–34.0)
MCHC: 32.9 g/dL (ref 30.0–36.0)
MCV: 81.3 fL (ref 78.0–100.0)
Platelets: 273 10*3/uL (ref 150–400)
RBC: 3.52 MIL/uL — ABNORMAL LOW (ref 3.87–5.11)
RDW: 16 % — ABNORMAL HIGH (ref 11.5–15.5)
WBC: 9.4 10*3/uL (ref 4.0–10.5)

## 2016-06-16 MED ORDER — MEDROXYPROGESTERONE ACETATE 150 MG/ML IM SUSP
150.0000 mg | Freq: Once | INTRAMUSCULAR | Status: AC
  Administered 2016-06-17: 150 mg via INTRAMUSCULAR
  Filled 2016-06-16: qty 1

## 2016-06-16 NOTE — Progress Notes (Signed)
Lilly Cove, LCSW Social Worker Signed Clinical Social Work Clinical Social Work Maternal 06/16/2016 9:57 AM    Expand All Collapse All    CLINICAL SOCIAL WORK MATERNAL/CHILD NOTE  Patient Details  Name: [name] [name] MRN: [id number] Date of Birth: 06/15/2016  Date: 06/16/2016  Clinical Social Worker Initiating Note: Lilly Cove, LCSWDate/ Time Initiated: 06/16/16/0956   Child's Name: [name] [name] (Seven spelled backwards)   Legal Guardian: Mother   Need for Interpreter: None   Date of Referral: 06/16/16   Reason for Referral: Current Substance Use/Substance Use During Pregnancy    Referral Source: RN   Address:    Phone number:     Household Members: Parents, Relatives   Natural Supports (not living in the home): [place], Medical laboratory scientific officer, Extended Family, Friends, Immediate Family   Professional Supports:Case Manager/Social Worker   Employment:Unemployed   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 LCSW met with [name], MGM, and Mgreat grandmother at the bedside. Explained role while in hospital and services along with reason for consult. [name] was open to assessment and appeared honest about drug use. [name] reports she stopped using THC 2-3 months prior to having baby. Reports she was a chronic smoker (cigs) and stopped smoking cigs in the first few months of pregnancy, but  replaced cigs with THC. [name] denies any mental health dx, but reports stress in life with her job and her relationship. [name] reports and gives history of FOB who she hopes will be involved, but at this time he is not. Reports they met last year at a gas station, engaged by the fall, and then found out she was pregnant. She did not disclose his reasons for no involvement at this time as she reports it is personal, but said they were praying for his involvement and he was a decent guy.  [name] aware of drug cord screen and educated about drug policy. She understands if baby's cord is positive then CPS report will be made, but at this time no intervention. [name] reports no other needs or concerns. She has strong family support with grandmothers in room and other family members. [name] reports she is nervous about being a first time mom and grandmother reports this is her first infant as she adopted [name] at the age of 25 years old. LCSW explained Healthy Start program and both very receptive and agreeable to program. Information given to [facility] and referral completed.  [name] has all necessary resources for baby. She is hopeful to be dc tomorrow if all is well with self and baby. LCSW will continue to follow cord.   CSW Plan/Description: Information/Referral to [employer] , [employer] , Other (Comment) (LCSW will follow cord and if positive will make CPS report)  Healthy Start referral completed.  Lilly Cove, LCSW 06/16/2016, 9:57 AM

## 2016-06-16 NOTE — Progress Notes (Addendum)
[name], [name]  Post Partum Day 1 Subjective: no complaints  Objective: Blood pressure 119/72, pulse 63, temperature 98.7 F (37.1 C), temperature source Oral, resp. rate 18, height 5\' 2"  (1.575 m), weight 97.07 kg (214 lb), last menstrual period 09/20/2015, SpO2 100 %, unknown if currently breastfeeding.   Physical Exam:  General: alert, cooperative and no distress Lochia: appropriate Uterine Fundus: firm DVT Evaluation: No evidence of DVT seen on physical exam.   Recent Labs  06/14/16 1650 06/16/16 0513  HGB 10.5* 9.4*  HCT 31.5* 28.6*   . ferrous sulfate  325 mg Oral Q breakfast  . ibuprofen  600 mg Oral Q6H  . medroxyPROGESTERone  150 mg Intramuscular Once  . pneumococcal 23 valent vaccine  0.5 mL Intramuscular Tomorrow-1000  . prenatal multivitamin  1 tablet Oral Q1200  . senna-docusate  2 tablet Oral Q24H  . Tdap  0.5 mL Intramuscular Once    Assessment/Plan: Plan for discharge tomorrow and Contraception Depoprovera  Patient has used Depo-Provera for many (over 2 years) years before.  We discussed other birth control method options including pills, patches, vaginal ring Mirena IUD, nexplanon, sterilization.  Patient declines all these other options and insists on getting Depo-Provera again. We discussed the risks of bone loss and fracture risk with use of Depo-Provera for more than 2 years. Patient accepts that risk and requests Depo-Provera injection before discharge from the hospital. All questions were answered.    -For outpatient circumcision of neonate.     LOS: 2 days   [facility] 06/16/2016, 3:38 PM

## 2016-06-16 NOTE — Lactation Note (Signed)
This note was copied from a baby's chart. Lactation Consultation Note Mom has decided to pump and bottle feed. Reviewed pumping parameters with her. Follow-up tomorrow if needed.  Patient Name: [name] [name] [id number] Date: 06/16/2016     Maternal Data    Feeding Feeding Type: Breast Milk Length of feed: 10 min  LATCH Score/Interventions Latch: Repeated attempts needed to sustain latch, nipple held in mouth throughout feeding, stimulation needed to elicit sucking reflex. Intervention(s): Skin to skin;Teach feeding cues;Waking techniques Intervention(s): Adjust position;Assist with latch;Breast massage  Audible Swallowing: A few with stimulation Intervention(s): Skin to skin  Type of Nipple: Flat Intervention(s): Double electric pump  Comfort (Breast/Nipple): Soft / non-tender     Hold (Positioning): Assistance needed to correctly position infant at breast and maintain latch. Intervention(s): Breastfeeding basics reviewed;Support Pillows;Position options;Skin to skin  LATCH Score: 6  Lactation Tools Discussed/Used Tools: Nipple Shields Nipple shield size: 24   Consult Status      Van Clines 06/16/2016, 5:51 PM

## 2016-06-17 MED ORDER — IBUPROFEN 600 MG PO TABS: 600.0000 mg | ORAL_TABLET | Freq: Four times a day (QID) | ORAL | Status: AC | PRN

## 2016-06-17 MED ORDER — MEDROXYPROGESTERONE ACETATE 150 MG/ML IM SUSP: 150.0000 mg | Freq: Once | INTRAMUSCULAR | Status: AC

## 2016-06-17 NOTE — Lactation Note (Signed)
Lactation Consultation Note   Follow up visit made prior to discharge.  Mom will pump and bottle feed for now due to difficult latch.  [facility] loaner completed and pump referral faxed to [facility] office.  Instructed to pump 8 times in 24 hours.  Encouraged to call with concerns prn.  Patient Name: [name] [name] [id number] Date: 06/17/2016     Maternal Data    Feeding    LATCH Score/Interventions                      Lactation Tools Discussed/Used     Consult Status      Ave Filter 06/17/2016, 8:54 AM

## 2016-06-17 NOTE — Discharge Summary (Signed)
[facility] Discharge Summary   Patient Name:   [name] [name] DOB:     1991/12/07 MRN:     [id number]  Date of Admission:   06/14/2016 Date of Discharge:  06/17/2016  Admitting diagnosis:    38wks nst prolonged monitoring Principal Problem:   Vaginal delivery Active Problems:   Positive GBS test   Vaginal vault hematoma s/p delivery--stable Oligohydramnios    Discharge diagnosis:    38wks nst prolonged monitoring Principal Problem:   Vaginal delivery Active Problems:   Positive GBS test   Vaginal vault hematoma s/p delivery--stable  Term Pregnancy Delivered and Anemia                                                                     Post partum procedures: None  Type of Delivery:  SVB  Delivering Provider: Donnel Saxon   Date of Delivery:  06/15/2016  Newborn Data:    Live born female  Birth Weight: 5 lb 5 oz (2410 g) APGARS: 8, 10  Baby's Name:  Catha Brow Feeding:   Breast Disposition:   home with mother  Complications:   None  Hospital course:      Induction of Labor With Vaginal Delivery   25 y.o. yo G1P1001 at [redacted]w[redacted]d was admitted to the hospital 06/14/2016 for induction of labor.  Indication for induction: Oligohydramnios.  Patient had an uncomplicated labor course as follows: Membrane Rupture Time/Date: 10:00 PM ,06/12/2016   Intrapartum Procedures: Episiotomy: None [1]                                         Lacerations:  Periurethral [8]  Patient had delivery of a Viable infant. 2 cm stable hematoma noted just inside vagina, appears to be in area of hymenal ring remnants. NT, no advancement during observation during repair. Ice pack applied to area. Recheck of small vaginal hematoma prior to transfer to [facility]--stable, no advancement in size, NT. Information for the patient's newborn:  Marzella, Biglow [id number]  Delivery Method: Vaginal, Spontaneous Delivery (Filed from Delivery Summary)    06/15/2016  Details of  delivery can be found in separate delivery note.  Patient had a routine postpartum course. Seen by SW for late pregnancy MJ use - no issues identified. Patient is discharged home 06/17/2016.  Physical Exam:   Filed Vitals:   06/16/16 0650 06/16/16 1757 06/17/16 0600 06/17/16 0635  BP: 119/72 123/73 144/104 130/89  Pulse: 63 86 78   Temp: 98.7 F (37.1 C) 98.9 F (37.2 C) 98.5 F (36.9 C)   TempSrc: Oral Oral Oral   Resp: 18 18 20    Height:      Weight:      SpO2:  96%     General: alert, cooperative and no distress Lochia: appropriate Uterine Fundus: firm Incision: Healing well with no significant drainage DVT Evaluation: No evidence of DVT seen on physical exam. Negative Homan's sign. No significant calf/ankle edema.  Labs: Lab Results  Component Value Date   WBC 9.4 06/16/2016   HGB 9.4* 06/16/2016   HCT 28.6* 06/16/2016   MCV 81.3 06/16/2016   PLT 273 06/16/2016  CMP Latest Ref Rng 05/23/2013  Glucose 70 - 99 mg/dL 107(H)  BUN 6 - 23 mg/dL 14  Creatinine 0.50 - 1.10 mg/dL 0.79  Sodium 135 - 145 mEq/L 138  Potassium 3.5 - 5.1 mEq/L 3.3(L)  Chloride 96 - 112 mEq/L 103  CO2 19 - 32 mEq/L 19  Calcium 8.4 - 10.5 mg/dL 9.0   Prenatal labs: ABO, Rh: --/--/A POS (07/06 1650) Antibody: negative Rubella: immune RPR: Nonreactive (04/18 0000)  HBsAg: Negative (01/20 0000)  HIV: Non-reactive (01/20 0000)  GBS: Positive (06/26 0000)    Discharge instruction: per After Visit Summary and "Baby and Me Booklet".  After Visit Meds:    Medication List    STOP taking these medications        promethazine 25 MG tablet  Commonly known as:  PHENERGAN      TAKE these medications        acetaminophen 325 MG tablet  Commonly known as:  TYLENOL  Take 325 mg by mouth every 6 (six) hours as needed for mild pain or headache.     ferrous sulfate 325 (65 FE) MG tablet  Take 325 mg by mouth daily with breakfast.     ibuprofen 600 MG tablet  Commonly known as:   ADVIL,MOTRIN  Take 1 tablet (600 mg total) by mouth every 6 (six) hours as needed for fever, headache, mild pain, moderate pain or cramping.     medroxyPROGESTERone 150 MG/ML injection  Commonly known as:  DEPO-PROVERA  Inject 1 mL (150 mg total) into the muscle once.     prenatal multivitamin Tabs tablet  Take 1 tablet by mouth daily at 12 noon.        Diet: routine diet  Activity: Advance as tolerated. Pelvic rest for 6 weeks.   Outpatient follow up:6 weeks   Outpatient follow up: Call the office to schedule outpatient circumcision.  Postpartum contraception: Depo Provera (received prior to discharge)  06/17/2016 Farrel Gordon, CNM

## 2016-06-17 NOTE — Discharge Instructions (Signed)
Postpartum Depression and Baby Blues The postpartum period begins right after the birth of a baby. During this time, there is often a great amount of joy and excitement. It is also a time of many changes in the life of the parents. Regardless of how many times a mother gives birth, each child brings new challenges and dynamics to the family. It is not unusual to have feelings of excitement along with confusing shifts in moods, emotions, and thoughts. All mothers are at risk of developing postpartum depression or the "baby blues." These mood changes can occur right after giving birth, or they may occur many months after giving birth. The baby blues or postpartum depression can be mild or severe. Additionally, postpartum depression can go away rather quickly, or it can be a long-term condition.  CAUSES Raised hormone levels and the rapid drop in those levels are thought to be a main cause of postpartum depression and the baby blues. A number of hormones change during and after pregnancy. Estrogen and progesterone usually decrease right after the delivery of your baby. The levels of thyroid hormone and various cortisol steroids also rapidly drop. Other factors that play a role in these mood changes include major life events and genetics.  RISK FACTORS If you have any of the following risks for the baby blues or postpartum depression, know what symptoms to watch out for during the postpartum period. Risk factors that may increase the likelihood of getting the baby blues or postpartum depression include:  Having a personal or family history of depression.   Having depression while being pregnant.   Having premenstrual mood issues or mood issues related to oral contraceptives.  Having a lot of life stress.   Having marital conflict.   Lacking a social support network.   Having a baby with special needs.   Having health problems, such as diabetes.  SIGNS AND SYMPTOMS Symptoms of baby blues  include:  Brief changes in mood, such as going from extreme happiness to sadness.  Decreased concentration.   Difficulty sleeping.   Crying spells, tearfulness.   Irritability.   Anxiety.  Symptoms of postpartum depression typically begin within the first month after giving birth. These symptoms include:  Difficulty sleeping or excessive sleepiness.   Marked weight loss.   Agitation.   Feelings of worthlessness.   Lack of interest in activity or food.  Postpartum psychosis is a very serious condition and can be dangerous. Fortunately, it is rare. Displaying any of the following symptoms is cause for immediate medical attention. Symptoms of postpartum psychosis include:   Hallucinations and delusions.   Bizarre or disorganized behavior.   Confusion or disorientation.  DIAGNOSIS  A diagnosis is made by an evaluation of your symptoms. There are no medical or lab tests that lead to a diagnosis, but there are various questionnaires that a health care provider may use to identify those with the baby blues, postpartum depression, or psychosis. Often, a screening tool called the [place] Postnatal Depression Scale is used to diagnose depression in the postpartum period.  TREATMENT The baby blues usually goes away on its own in 1-2 weeks. Social support is often all that is needed. You will be encouraged to get adequate sleep and rest. Occasionally, you may be given medicines to help you sleep.  Postpartum depression requires treatment because it can last several months or longer if it is not treated. Treatment may include individual or group therapy, medicine, or both to address any social, physiological, and psychological  factors that may play a role in the depression. Regular exercise, a healthy diet, rest, and social support may also be strongly recommended.  Postpartum psychosis is more serious and needs treatment right away. Hospitalization is often needed. HOME CARE  INSTRUCTIONS  Get as much rest as you can. Nap when the baby sleeps.   Exercise regularly. Some women find yoga and walking to be beneficial.   Eat a balanced and nourishing diet.   Do little things that you enjoy. Have a cup of tea, take a bubble bath, read your favorite magazine, or listen to your favorite music.  Avoid alcohol.   Ask for help with household chores, cooking, grocery shopping, or running errands as needed. Do not try to do everything.   Talk to people close to you about how you are feeling. Get support from your partner, family members, friends, or other new moms.  Try to stay positive in how you think. Think about the things you are grateful for.   Do not spend a lot of time alone.   Only take over-the-counter or prescription medicine as directed by your health care provider.  Keep all your postpartum appointments.   Let your health care provider know if you have any concerns.  SEEK MEDICAL CARE IF: You are having a reaction to or problems with your medicine. SEEK IMMEDIATE MEDICAL CARE IF:  You have suicidal feelings.   You think you may harm the baby or someone else. MAKE SURE YOU:  Understand these instructions.  Will watch your condition.  Will get help right away if you are not doing well or get worse.   This information is not intended to replace advice given to you by your health care provider. Make sure you discuss any questions you have with your health care provider.   Document Released: 08/30/2004 Document Revised: 12/01/2013 Document Reviewed: 09/07/2013 Elsevier Interactive Patient Education 2016 [employer]. Iron-Rich Diet Iron is a mineral that helps your body to produce hemoglobin. Hemoglobin is a protein in your red blood cells that carries oxygen to your body's tissues. Eating too little iron may cause you to feel weak and tired, and it can increase your risk for infection. Eating enough iron is necessary for your body's  metabolism, muscle function, and nervous system. Iron is naturally found in many foods. It can also be added to foods or fortified in foods. There are two types of dietary iron:  Heme iron. Heme iron is absorbed by the body more easily than nonheme iron. Heme iron is found in meat, poultry, and fish.  Nonheme iron. Nonheme iron is found in dietary supplements, iron-fortified grains, beans, and vegetables. You may need to follow an iron-rich diet if:  You have been diagnosed with iron deficiency or iron-deficiency anemia.  You have a condition that prevents you from absorbing dietary iron, such as:  Infection in your intestines.  Celiac disease. This involves long-lasting (chronic) inflammation of your intestines.  You do not eat enough iron.  You eat a diet that is high in foods that impair iron absorption.  You have lost a lot of blood.  You have heavy bleeding during your menstrual cycle.  You are pregnant. WHAT IS MY PLAN? Your health care provider may help you to determine how much iron you need per day based on your condition. Generally, when a person consumes sufficient amounts of iron in the diet, the following iron needs are met:  Men.  14-75 years old: 11 mg per day.  65-62 years old: 8 mg per day.  Women.   26-21 years old: 15 mg per day.  41-4 years old: 18 mg per day.  Over 73 years old: 8 mg per day.  Pregnant women: 27 mg per day.  Breastfeeding women: 9 mg per day. WHAT DO I NEED TO KNOW ABOUT AN IRON-RICH DIET?  Eat fresh fruits and vegetables that are high in vitamin C along with foods that are high in iron. This will help increase the amount of iron that your body absorbs from food, especially with foods containing nonheme iron. Foods that are high in vitamin C include oranges, peppers, tomatoes, and mango.  Take iron supplements only as directed by your health care provider. Overdose of iron can be life-threatening. If you were prescribed iron  supplements, take them with orange juice or a vitamin C supplement.  Cook foods in pots and pans that are made from iron.   Eat nonheme iron-containing foods alongside foods that are high in heme iron. This helps to improve your iron absorption.   Certain foods and drinks contain compounds that impair iron absorption. Avoid eating these foods in the same meal as iron-rich foods or with iron supplements. These include:  Coffee, black tea, and red wine.  Milk, dairy products, and foods that are high in calcium.  Beans, soybeans, and peas.  Whole grains.  When eating foods that contain both nonheme iron and compounds that impair iron absorption, follow these tips to absorb iron better.   Soak beans overnight before cooking.  Soak whole grains overnight and drain them before using.  Ferment flours before baking, such as using yeast in bread dough. WHAT FOODS CAN I EAT? Grains Iron-fortified breakfast cereal. Iron-fortified whole-wheat bread. Enriched rice. Sprouted grains. Vegetables Spinach. Potatoes with skin. Green peas. Broccoli. Red and green bell peppers. Fermented vegetables. Fruits Prunes. Raisins. Oranges. Strawberries. Mango. Grapefruit. Meats and Other Protein Sources Beef liver. Oysters. Beef. Shrimp. [place]. Chicken. [place]. Sardines. Chickpeas. Nuts. Tofu. Beverages Tomato juice. Fresh orange juice. Prune juice. Hibiscus tea. Fortified instant breakfast shakes. Condiments Tahini. Fermented soy sauce. Sweets and Desserts Black-strap molasses.  Other Wheat germ. The items listed above may not be a complete list of recommended foods or beverages. Contact your dietitian for more options. WHAT FOODS ARE NOT RECOMMENDED? Grains Whole grains. Bran cereal. Bran flour. Oats. Vegetables Artichokes. Brussels sprouts. Kale. Fruits Blueberries. Raspberries. Strawberries. Figs. Meats and Other Protein Sources Soybeans. Products made from soy  protein. Dairy Milk. Cream. Cheese. Yogurt. Cottage cheese. Beverages Coffee. Black tea. Red wine. Sweets and Desserts Cocoa. Chocolate. Ice cream. Other Basil. Oregano. Parsley. The items listed above may not be a complete list of foods and beverages to avoid. Contact your dietitian for more information.   This information is not intended to replace advice given to you by your health care provider. Make sure you discuss any questions you have with your health care provider.   Document Released: 07/10/2005 Document Revised: 12/17/2014 Document Reviewed: 06/23/2014 Elsevier Interactive Patient Education 2016 [employer]. Anemia, Nonspecific Anemia is a condition in which the concentration of red blood cells or hemoglobin in the blood is below normal. Hemoglobin is a substance in red blood cells that carries oxygen to the tissues of the body. Anemia results in not enough oxygen reaching these tissues.  CAUSES  Common causes of anemia include:   Excessive bleeding. Bleeding may be internal or external. This includes excessive bleeding from periods (in women) or from the intestine.  Poor nutrition.   Chronic kidney, thyroid, and liver disease.  Bone marrow disorders that decrease red blood cell production.  Cancer and treatments for cancer.  HIV, AIDS, and their treatments.  Spleen problems that increase red blood cell destruction.  Blood disorders.  Excess destruction of red blood cells due to infection, medicines, and autoimmune disorders. SIGNS AND SYMPTOMS   Minor weakness.   Dizziness.   Headache.  Palpitations.   Shortness of breath, especially with exercise.   Paleness.  Cold sensitivity.  Indigestion.  Nausea.  Difficulty sleeping.  Difficulty concentrating. Symptoms may occur suddenly or they may develop slowly.  DIAGNOSIS  Additional blood tests are often needed. These help your health care provider determine the best treatment. Your  health care provider will check your stool for blood and look for other causes of blood loss.  TREATMENT  Treatment varies depending on the cause of the anemia. Treatment can include:   Supplements of iron, vitamin O03, or folic acid.   Hormone medicines.   A blood transfusion. This may be needed if blood loss is severe.   Hospitalization. This may be needed if there is significant continual blood loss.   Dietary changes.  Spleen removal. HOME CARE INSTRUCTIONS Keep all follow-up appointments. It often takes many weeks to correct anemia, and having your health care provider check on your condition and your response to treatment is very important. SEEK IMMEDIATE MEDICAL CARE IF:   You develop extreme weakness, shortness of breath, or chest pain.   You become dizzy or have trouble concentrating.  You develop heavy vaginal bleeding.   You develop a rash.   You have bloody or black, tarry stools.   You faint.   You vomit up blood.   You vomit repeatedly.   You have abdominal pain.  You have a fever or persistent symptoms for more than 2-3 days.   You have a fever and your symptoms suddenly get worse.   You are dehydrated.  MAKE SURE YOU:  Understand these instructions.  Will watch your condition.  Will get help right away if you are not doing well or get worse.   This information is not intended to replace advice given to you by your health care provider. Make sure you discuss any questions you have with your health care provider.   Document Released: 01/03/2005 Document Revised: 07/29/2013 Document Reviewed: 05/22/2013 Elsevier Interactive Patient Education 2016 [employer] Breastfeeding and Mastitis Mastitis is inflammation of the breast tissue. It can occur in women who are breastfeeding. This can make breastfeeding painful. Mastitis will sometimes go away on its own. Your health care provider will help determine if treatment is  needed. CAUSES Mastitis is often associated with a blocked milk (lactiferous) duct. This can happen when too much milk builds up in the breast. Causes of excess milk in the breast can include:  Poor latch-on. If your baby is not latched onto the breast properly, she or he may not empty your breast completely while breastfeeding.  Allowing too much time to pass between feedings.  Wearing a bra or other clothing that is too tight. This puts extra pressure on the lactiferous ducts so milk does not flow through them as it should. Mastitis can also be caused by a bacterial infection. Bacteria may enter the breast tissue through cuts or openings in the skin. In women who are breastfeeding, this may occur because of cracked or irritated skin. Cracks in the skin are often caused when your baby does  not latch on properly to the breast. SIGNS AND SYMPTOMS  Swelling, redness, tenderness, and pain in an area of the breast.  Swelling of the glands under the arm on the same side.  Fever may or may not accompany mastitis. If an infection is allowed to progress, a collection of pus (abscess) may develop. DIAGNOSIS  Your health care provider can usually diagnose mastitis based on your symptoms and a physical exam. Tests may be done to help confirm the diagnosis. These may include:  Removal of pus from the breast by applying pressure to the area. This pus can be examined in the lab to determine which bacteria are present. If an abscess has developed, the fluid in the abscess can be removed with a needle. This can also be used to confirm the diagnosis and determine the bacteria present. In most cases, pus will not be present.  Blood tests to determine if your body is fighting a bacterial infection.  Mammogram or ultrasound tests to rule out other problems or diseases. TREATMENT  Mastitis that occurs with breastfeeding will sometimes go away on its own. Your health care provider may choose to wait 24 hours  after first seeing you to decide whether a prescription medicine is needed. If your symptoms are worse after 24 hours, your health care provider will likely prescribe an antibiotic medicine to treat the mastitis. He or she will determine which bacteria are most likely causing the infection and will then select an appropriate antibiotic medicine. This is sometimes changed based on the results of tests performed to identify the bacteria, or if there is no response to the antibiotic medicine selected. Antibiotic medicines are usually given by mouth. You may also be given medicine for pain. HOME CARE INSTRUCTIONS  Only take over-the-counter or prescription medicines for pain, fever, or discomfort as directed by your health care provider.  If your health care provider prescribed an antibiotic medicine, take the medicine as directed. Make sure you finish it even if you start to feel better.  Do not wear a tight or underwire bra. Wear a soft, supportive bra.  Increase your fluid intake, especially if you have a fever.  Continue to empty the breast. Your health care provider can tell you whether this milk is safe for your infant or needs to be thrown out. You may be told to stop nursing until your health care provider thinks it is safe for your baby. Use a breast pump if you are advised to stop nursing.  Keep your nipples clean and dry.  Empty the first breast completely before going to the other breast. If your baby is not emptying your breasts completely for some reason, use a breast pump to empty your breasts.  If you go back to work, pump your breasts while at work to stay in time with your nursing schedule.  Avoid allowing your breasts to become overly filled with milk (engorged). SEEK MEDICAL CARE IF:  You have pus-like discharge from the breast.  Your symptoms do not improve with the treatment prescribed by your health care provider within 2 days. SEEK IMMEDIATE MEDICAL CARE IF:  Your pain  and swelling are getting worse.  You have pain that is not controlled with medicine.  You have a red line extending from the breast toward your armpit.  You have a fever or persistent symptoms for more than 2-3 days.  You have a fever and your symptoms suddenly get worse. MAKE SURE YOU:   Understand these instructions.  Will watch your condition.  Will get help right away if you are not doing well or get worse.   This information is not intended to replace advice given to you by your health care provider. Make sure you discuss any questions you have with your health care provider.   Document Released: 03/23/2005 Document Revised: 12/01/2013 Document Reviewed: 07/02/2013 Elsevier Interactive Patient Education [employer]. Breastfeeding Deciding to breastfeed is one of the best choices you can make for you and your baby. A change in hormones during pregnancy causes your breast tissue to grow and increases the number and size of your milk ducts. These hormones also allow proteins, sugars, and fats from your blood supply to make breast milk in your milk-producing glands. Hormones prevent breast milk from being released before your baby is born as well as prompt milk flow after birth. Once breastfeeding has begun, thoughts of your baby, as well as his or her sucking or crying, can stimulate the release of milk from your milk-producing glands.  BENEFITS OF BREASTFEEDING For Your Baby  Your first milk (colostrum) helps your baby's digestive system function better.  There are antibodies in your milk that help your baby fight off infections.  Your baby has a lower incidence of asthma, allergies, and sudden infant death syndrome.  The nutrients in breast milk are better for your baby than infant formulas and are designed uniquely for your baby's needs.  Breast milk improves your baby's brain development.  Your baby is less likely to develop other conditions, such as childhood  obesity, asthma, or type 2 diabetes mellitus. For You  Breastfeeding helps to create a very special bond between you and your baby.  Breastfeeding is convenient. Breast milk is always available at the correct temperature and costs nothing.  Breastfeeding helps to burn calories and helps you lose the weight gained during pregnancy.  Breastfeeding makes your uterus contract to its prepregnancy size faster and slows bleeding (lochia) after you give birth.   Breastfeeding helps to lower your risk of developing type 2 diabetes mellitus, osteoporosis, and breast or ovarian cancer later in life. SIGNS THAT YOUR BABY IS HUNGRY Early Signs of Hunger  Increased alertness or activity.  Stretching.  Movement of the head from side to side.  Movement of the head and opening of the mouth when the corner of the mouth or cheek is stroked (rooting).  Increased sucking sounds, smacking lips, cooing, sighing, or squeaking.  Hand-to-mouth movements.  Increased sucking of fingers or hands. Late Signs of Hunger  Fussing.  Intermittent crying. Extreme Signs of Hunger Signs of extreme hunger will require calming and consoling before your baby will be able to breastfeed successfully. Do not wait for the following signs of extreme hunger to occur before you initiate breastfeeding:  Restlessness.  A loud, strong cry.  Screaming. BREASTFEEDING BASICS Breastfeeding Initiation  Find a comfortable place to sit or lie down, with your neck and back well supported.  Place a pillow or rolled up blanket under your baby to bring him or her to the level of your breast (if you are seated). Nursing pillows are specially designed to help support your arms and your baby while you breastfeed.  Make sure that your baby's abdomen is facing your abdomen.  Gently massage your breast. With your fingertips, massage from your chest wall toward your nipple in a circular motion. This encourages milk flow. You may need  to continue this action during the feeding if your milk flows slowly.  Support your breast with 4 fingers underneath and your thumb above your nipple. Make sure your fingers are well away from your nipple and your baby's mouth.  Stroke your baby's lips gently with your finger or nipple.  When your baby's mouth is open wide enough, quickly bring your baby to your breast, placing your entire nipple and as much of the colored area around your nipple (areola) as possible into your baby's mouth.  More areola should be visible above your baby's upper lip than below the lower lip.  Your baby's tongue should be between his or her lower gum and your breast.  Ensure that your baby's mouth is correctly positioned around your nipple (latched). Your baby's lips should create a seal on your breast and be turned out (everted).  It is common for your baby to suck about 2-3 minutes in order to start the flow of breast milk. Latching Teaching your baby how to latch on to your breast properly is very important. An improper latch can cause nipple pain and decreased milk supply for you and poor weight gain in your baby. Also, if your baby is not latched onto your nipple properly, he or she may swallow some air during feeding. This can make your baby fussy. Burping your baby when you switch breasts during the feeding can help to get rid of the air. However, teaching your baby to latch on properly is still the best way to prevent fussiness from swallowing air while breastfeeding. Signs that your baby has successfully latched on to your nipple:  Silent tugging or silent sucking, without causing you pain.  Swallowing heard between every 3-4 sucks.  Muscle movement above and in front of his or her ears while sucking. Signs that your baby has not successfully latched on to nipple:  Sucking sounds or smacking sounds from your baby while breastfeeding.  Nipple pain. If you think your baby has not latched on  correctly, slip your finger into the corner of your baby's mouth to break the suction and place it between your baby's gums. Attempt breastfeeding initiation again. Signs of Successful Breastfeeding Signs from your baby:  A gradual decrease in the number of sucks or complete cessation of sucking.  Falling asleep.  Relaxation of his or her body.  Retention of a small amount of milk in his or her mouth.  Letting go of your breast by himself or herself. Signs from you:  Breasts that have increased in firmness, weight, and size 1-3 hours after feeding.  Breasts that are softer immediately after breastfeeding.  Increased milk volume, as well as a change in milk consistency and color by the fifth day of breastfeeding.  Nipples that are not sore, cracked, or bleeding. Signs That Your [employer] is Getting Enough Milk  Wetting at least 3 diapers in a 24-hour period. The urine should be clear and pale yellow by age 25 days.  At least 3 stools in a 24-hour period by age 25 days. The stool should be soft and yellow.  At least 3 stools in a 24-hour period by age 76 days. The stool should be seedy and yellow.  No loss of weight greater than 10% of birth weight during the first 39 days of age.  Average weight gain of 4-7 ounces (113-198 g) per week after age 52 days.  Consistent daily weight gain by age 39 days, without weight loss after the age of 2 weeks. After a feeding, your baby may spit up a small amount. This  is common. BREASTFEEDING FREQUENCY AND DURATION Frequent feeding will help you make more milk and can prevent sore nipples and breast engorgement. Breastfeed when you feel the need to reduce the fullness of your breasts or when your baby shows signs of hunger. This is called "breastfeeding on demand." Avoid introducing a pacifier to your baby while you are working to establish breastfeeding (the first 4-6 weeks after your baby is born). After this time you may choose to use a pacifier.  Research has shown that pacifier use during the first year of a baby's life decreases the risk of sudden infant death syndrome (SIDS). Allow your baby to feed on each breast as long as he or she wants. Breastfeed until your baby is finished feeding. When your baby unlatches or falls asleep while feeding from the first breast, offer the second breast. Because newborns are often sleepy in the first few weeks of life, you may need to awaken your baby to get him or her to feed. Breastfeeding times will vary from baby to baby. However, the following rules can serve as a guide to help you ensure that your baby is properly fed:  Newborns (babies 25 weeks of age or younger) may breastfeed every 1-3 hours.  Newborns should not go longer than 3 hours during the day or 5 hours during the night without breastfeeding.  You should breastfeed your baby a minimum of 8 times in a 24-hour period until you begin to introduce solid foods to your baby at around 25 months of age. BREAST MILK PUMPING Pumping and storing breast milk allows you to ensure that your baby is exclusively fed your breast milk, even at times when you are unable to breastfeed. This is especially important if you are going back to work while you are still breastfeeding or when you are not able to be present during feedings. Your lactation consultant can give you guidelines on how long it is safe to store breast milk. A breast pump is a machine that allows you to pump milk from your breast into a sterile bottle. The pumped breast milk can then be stored in a refrigerator or freezer. Some breast pumps are operated by hand, while others use electricity. Ask your lactation consultant which type will work best for you. Breast pumps can be purchased, but some hospitals and breastfeeding support groups lease breast pumps on a monthly basis. A lactation consultant can teach you how to hand express breast milk, if you prefer not to use a pump. CARING FOR YOUR  BREASTS WHILE YOU BREASTFEED Nipples can become dry, cracked, and sore while breastfeeding. The following recommendations can help keep your breasts moisturized and healthy:  Avoid using soap on your nipples.  Wear a supportive bra. Although not required, special nursing bras and tank tops are designed to allow access to your breasts for breastfeeding without taking off your entire bra or top. Avoid wearing underwire-style bras or extremely tight bras.  Air dry your nipples for 3-61mnutes after each feeding.  Use only cotton bra pads to absorb leaked breast milk. Leaking of breast milk between feedings is normal.  Use lanolin on your nipples after breastfeeding. Lanolin helps to maintain your skin's normal moisture barrier. If you use pure lanolin, you do not need to wash it off before feeding your baby again. Pure lanolin is not toxic to your baby. You may also hand express a few drops of breast milk and gently massage that milk into your nipples and allow the milk  to air dry. In the first few weeks after giving birth, some women experience extremely full breasts (engorgement). Engorgement can make your breasts feel heavy, warm, and tender to the touch. Engorgement peaks within 3-5 days after you give birth. The following recommendations can help ease engorgement:  Completely empty your breasts while breastfeeding or pumping. You may want to start by applying warm, moist heat (in the shower or with warm water-soaked hand towels) just before feeding or pumping. This increases circulation and helps the milk flow. If your baby does not completely empty your breasts while breastfeeding, pump any extra milk after he or she is finished.  Wear a snug bra (nursing or regular) or tank top for 1-2 days to signal your body to slightly decrease milk production.  Apply ice packs to your breasts, unless this is too uncomfortable for you.  Make sure that your baby is latched on and positioned properly while  breastfeeding. If engorgement persists after 48 hours of following these recommendations, contact your health care provider or a Science writer. OVERALL HEALTH CARE RECOMMENDATIONS WHILE BREASTFEEDING  Eat healthy foods. Alternate between meals and snacks, eating 3 of each per day. Because what you eat affects your breast milk, some of the foods may make your baby more irritable than usual. Avoid eating these foods if you are sure that they are negatively affecting your baby.  Drink milk, fruit juice, and water to satisfy your thirst (about 10 glasses a day).  Rest often, relax, and continue to take your prenatal vitamins to prevent fatigue, stress, and anemia.  Continue breast self-awareness checks.  Avoid chewing and smoking tobacco. Chemicals from cigarettes that pass into breast milk and exposure to secondhand smoke may harm your baby.  Avoid alcohol and drug use, including marijuana. Some medicines that may be harmful to your baby can pass through breast milk. It is important to ask your health care provider before taking any medicine, including all over-the-counter and prescription medicine as well as vitamin and herbal supplements. It is possible to become pregnant while breastfeeding. If birth control is desired, ask your health care provider about options that will be safe for your baby. SEEK MEDICAL CARE IF:  You feel like you want to stop breastfeeding or have become frustrated with breastfeeding.  You have painful breasts or nipples.  Your nipples are cracked or bleeding.  Your breasts are red, tender, or warm.  You have a swollen area on either breast.  You have a fever or chills.  You have nausea or vomiting.  You have drainage other than breast milk from your nipples.  Your breasts do not become full before feedings by the fifth day after you give birth.  You feel sad and depressed.  Your baby is too sleepy to eat well.  Your baby is having trouble  sleeping.   Your baby is wetting less than 3 diapers in a 24-hour period.  Your baby has less than 3 stools in a 24-hour period.  Your baby's skin or the white part of his or her eyes becomes yellow.   Your baby is not gaining weight by 73 days of age. SEEK IMMEDIATE MEDICAL CARE IF:  Your baby is overly tired (lethargic) and does not want to wake up and feed.  Your baby develops an unexplained fever.   This information is not intended to replace advice given to you by your health care provider. Make sure you discuss any questions you have with your health care provider.  Document Released: 11/26/2005 Document Revised: 08/17/2015 Document Reviewed: 05/20/2013 Elsevier Interactive Patient Education 2016 [employer]. Postpartum Care After Vaginal Delivery After you deliver your newborn (postpartum period), the usual stay in the hospital is 24-72 hours. If there were problems with your labor or delivery, or if you have other medical problems, you might be in the hospital longer.  While you are in the hospital, you will receive help and instructions on how to care for yourself and your newborn during the postpartum period.  While you are in the hospital:  Be sure to tell your nurses if you have pain or discomfort, as well as where you feel the pain and what makes the pain worse.  If you had an incision made near your vagina (episiotomy) or if you had some tearing during delivery, the nurses may put ice packs on your episiotomy or tear. The ice packs may help to reduce the pain and swelling.  If you are breastfeeding, you may feel uncomfortable contractions of your uterus for a couple of weeks. This is normal. The contractions help your uterus get back to normal size.  It is normal to have some bleeding after delivery.  For the first 1-3 days after delivery, the flow is red and the amount may be similar to a period.  It is common for the flow to start and stop.  In the first few  days, you may pass some small clots. Let your nurses know if you begin to pass large clots or your flow increases.  Do not  flush blood clots down the toilet before having the nurse look at them.  During the next 3-10 days after delivery, your flow should become more watery and pink or brown-tinged in color.  Ten to fourteen days after delivery, your flow should be a small amount of yellowish-white discharge.  The amount of your flow will decrease over the first few weeks after delivery. Your flow may stop in 6-8 weeks. Most women have had their flow stop by 12 weeks after delivery.  You should change your sanitary pads frequently.  Wash your hands thoroughly with soap and water for at least 20 seconds after changing pads, using the toilet, or before holding or feeding your newborn.  You should feel like you need to empty your bladder within the first 6-8 hours after delivery.  In case you become weak, lightheaded, or faint, call your nurse before you get out of bed for the first time and before you take a shower for the first time.  Within the first few days after delivery, your breasts may begin to feel tender and full. This is called engorgement. Breast tenderness usually goes away within 48-72 hours after engorgement occurs. You may also notice milk leaking from your breasts. If you are not breastfeeding, do not stimulate your breasts. Breast stimulation can make your breasts produce more milk.  Spending as much time as possible with your newborn is very important. During this time, you and your newborn can feel close and get to know each other. Having your newborn stay in your room (rooming in) will help to strengthen the bond with your newborn. It will give you time to get to know your newborn and become comfortable caring for your newborn.  Your hormones change after delivery. Sometimes the hormone changes can temporarily cause you to feel sad or tearful. These feelings should not last  more than a few days. If these feelings last longer than that, you should talk  to your caregiver.  If desired, talk to your caregiver about methods of family planning or contraception.  Talk to your caregiver about immunizations. Your caregiver may want you to have the following immunizations before leaving the hospital:  Tetanus, diphtheria, and pertussis (Tdap) or tetanus and diphtheria (Td) immunization. It is very important that you and your family (including grandparents) or others caring for your newborn are up-to-date with the Tdap or Td immunizations. The Tdap or Td immunization can help protect your newborn from getting ill.  Rubella immunization.  Varicella (chickenpox) immunization.  Influenza immunization. You should receive this annual immunization if you did not receive the immunization during your pregnancy.   This information is not intended to replace advice given to you by your health care provider. Make sure you discuss any questions you have with your health care provider.   Document Released: 09/23/2007 Document Revised: 08/20/2012 Document Reviewed: 07/23/2012 Elsevier Interactive Patient Education 2016 [employer]. Medroxyprogesterone injection [Contraceptive] What is this medicine? MEDROXYPROGESTERONE (me DROX ee proe JES te rone) contraceptive injections prevent pregnancy. They provide effective birth control for 3 months. Depo-subQ Provera 104 is also used for treating pain related to endometriosis. This medicine may be used for other purposes; ask your health care provider or pharmacist if you have questions. What should I tell my health care provider before I take this medicine? They need to know if you have any of these conditions: -frequently drink alcohol -asthma -blood vessel disease or a history of a blood clot in the lungs or legs -bone disease such as osteoporosis -breast cancer -diabetes -eating disorder (anorexia nervosa or bulimia) -high blood  pressure -HIV infection or AIDS -kidney disease -liver disease -mental depression -migraine -seizures (convulsions) -stroke -tobacco smoker -vaginal bleeding -an unusual or allergic reaction to medroxyprogesterone, other hormones, medicines, foods, dyes, or preservatives -pregnant or trying to get pregnant -breast-feeding How should I use this medicine? Depo-Provera Contraceptive injection is given into a muscle. Depo-subQ Provera 104 injection is given under the skin. These injections are given by a health care professional. You must not be pregnant before getting an injection. The injection is usually given during the first 5 days after the start of a menstrual period or 6 weeks after delivery of a baby. Talk to your pediatrician regarding the use of this medicine in children. Special care may be needed. These injections have been used in female children who have started having menstrual periods. Overdosage: If you think you have taken too much of this medicine contact a poison control center or emergency room at once. NOTE: This medicine is only for you. Do not share this medicine with others. What if I miss a dose? Try not to miss a dose. You must get an injection once every 3 months to maintain birth control. If you cannot keep an appointment, call and reschedule it. If you wait longer than 13 weeks between Depo-Provera contraceptive injections or longer than 14 weeks between Depo-subQ Provera 104 injections, you could get pregnant. Use another method for birth control if you miss your appointment. You may also need a pregnancy test before receiving another injection. What may interact with this medicine? Do not take this medicine with any of the following medications: -bosentan This medicine may also interact with the following medications: -aminoglutethimide -antibiotics or medicines for infections, especially rifampin, rifabutin, rifapentine, and  griseofulvin -aprepitant -barbiturate medicines such as phenobarbital or primidone -bexarotene -carbamazepine -medicines for seizures like ethotoin, felbamate, oxcarbazepine, phenytoin, topiramate -modafinil -St. John's wort This  list may not describe all possible interactions. Give your health care provider a list of all the medicines, herbs, non-prescription drugs, or dietary supplements you use. Also tell them if you smoke, drink alcohol, or use illegal drugs. Some items may interact with your medicine. What should I watch for while using this medicine? This drug does not protect you against HIV infection (AIDS) or other sexually transmitted diseases. Use of this product may cause you to lose calcium from your bones. Loss of calcium may cause weak bones (osteoporosis). Only use this product for more than 2 years if other forms of birth control are not right for you. The longer you use this product for birth control the more likely you will be at risk for weak bones. Ask your health care professional how you can keep strong bones. You may have a change in bleeding pattern or irregular periods. Many females stop having periods while taking this drug. If you have received your injections on time, your chance of being pregnant is very low. If you think you may be pregnant, see your health care professional as soon as possible. Tell your health care professional if you want to get pregnant within the next year. The effect of this medicine may last a long time after you get your last injection. What side effects may I notice from receiving this medicine? Side effects that you should report to your doctor or health care professional as soon as possible: -allergic reactions like skin rash, itching or hives, swelling of the face, lips, or tongue -breast tenderness or discharge -breathing problems -changes in vision -depression -feeling faint or lightheaded, falls -fever -pain in the abdomen, chest,  groin, or leg -problems with balance, talking, walking -unusually weak or tired -yellowing of the eyes or skin Side effects that usually do not require medical attention (report to your doctor or health care professional if they continue or are bothersome): -acne -fluid retention and swelling -headache -irregular periods, spotting, or absent periods -temporary pain, itching, or skin reaction at site where injected -weight gain This list may not describe all possible side effects. Call your doctor for medical advice about side effects. You may report side effects to FDA at 1-800-FDA-1088. Where should I keep my medicine? This does not apply. The injection will be given to you by a health care professional. NOTE: This sheet is a summary. It may not cover all possible information. If you have questions about this medicine, talk to your doctor, pharmacist, or health care provider.    2016, Elsevier/Gold Standard. (2008-12-17 18:37:56)

## 2016-09-06 IMAGING — US US MFM FETAL BPP W/O NON-STRESS
1 series · 12 of 28 positions shown · non-contrast
Comparison: none

[Series 1: us mfm fetal bpp w/o non-stress · 56 acquisitions, 12 frames shown]
[im 3/56]
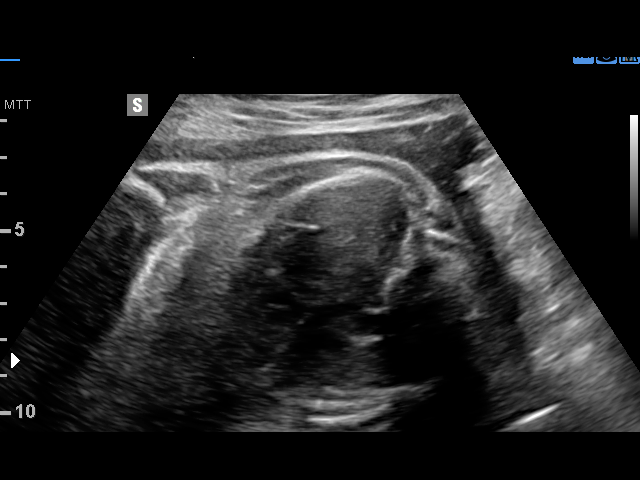
[im 7/56]
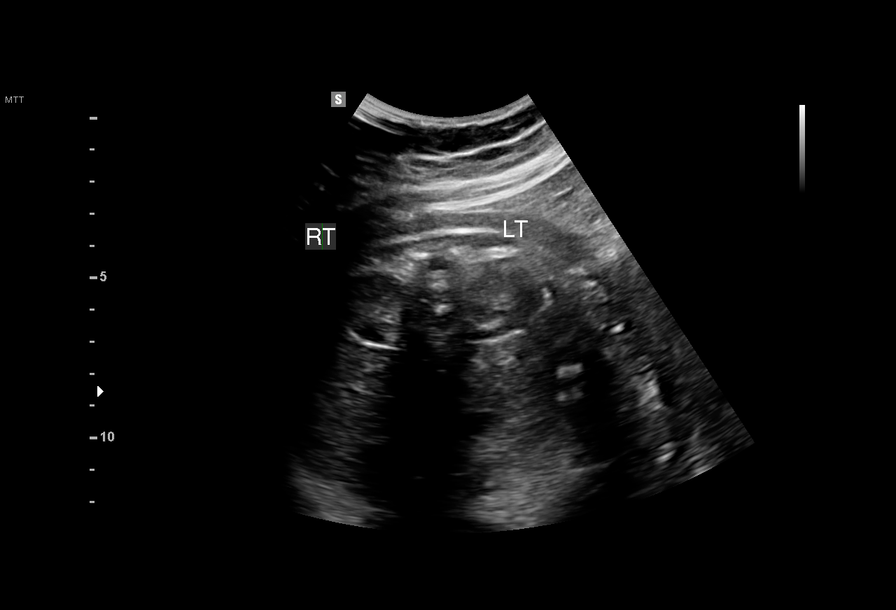
[im 11/56]
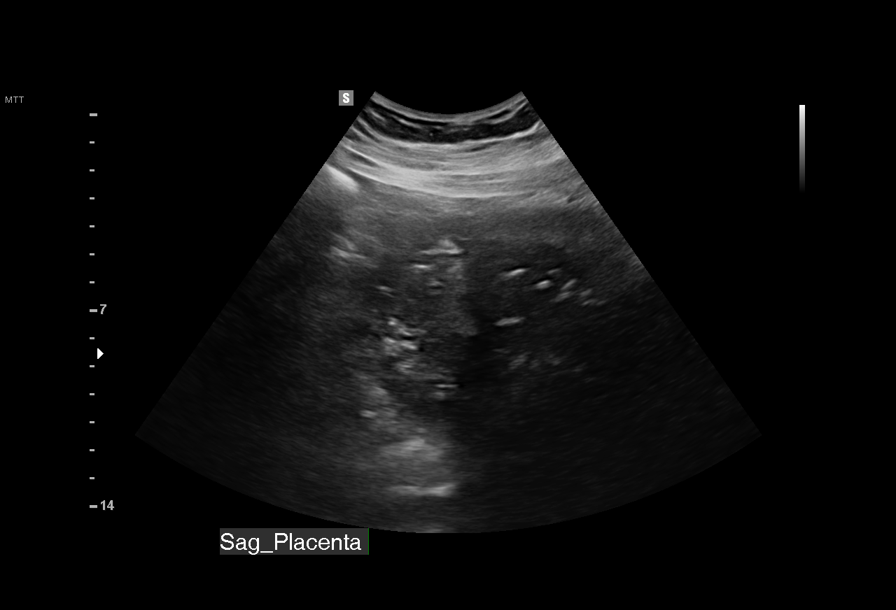
[im 17/56]
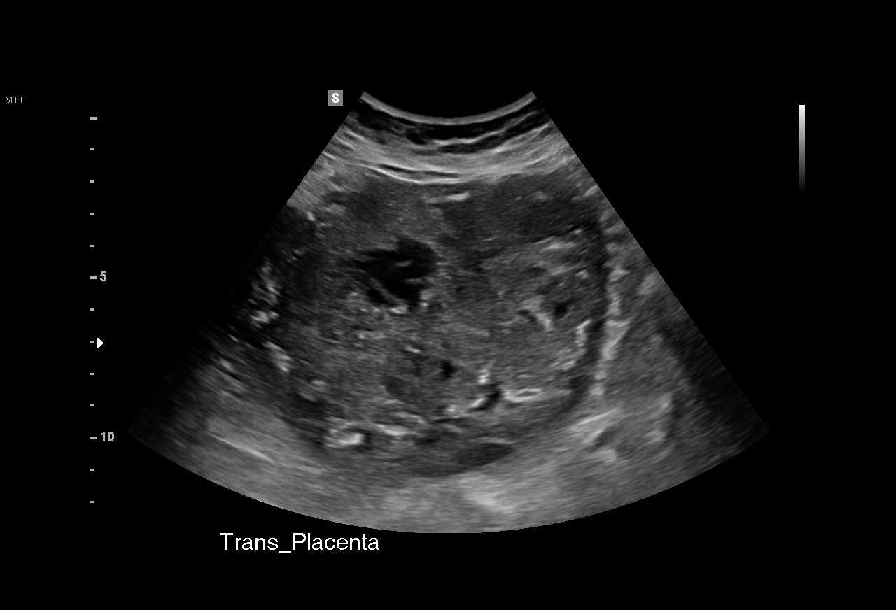
[im 21/56]
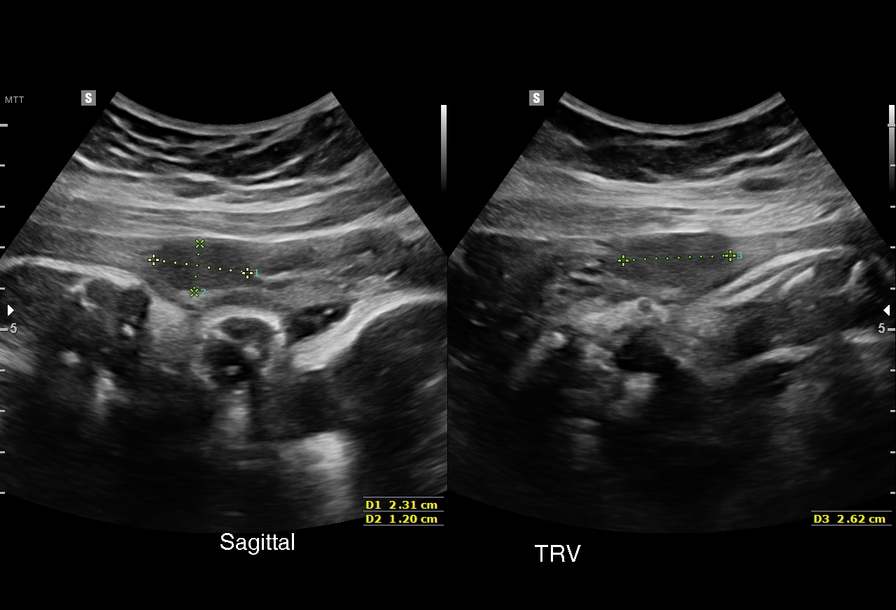
[im 25/56]
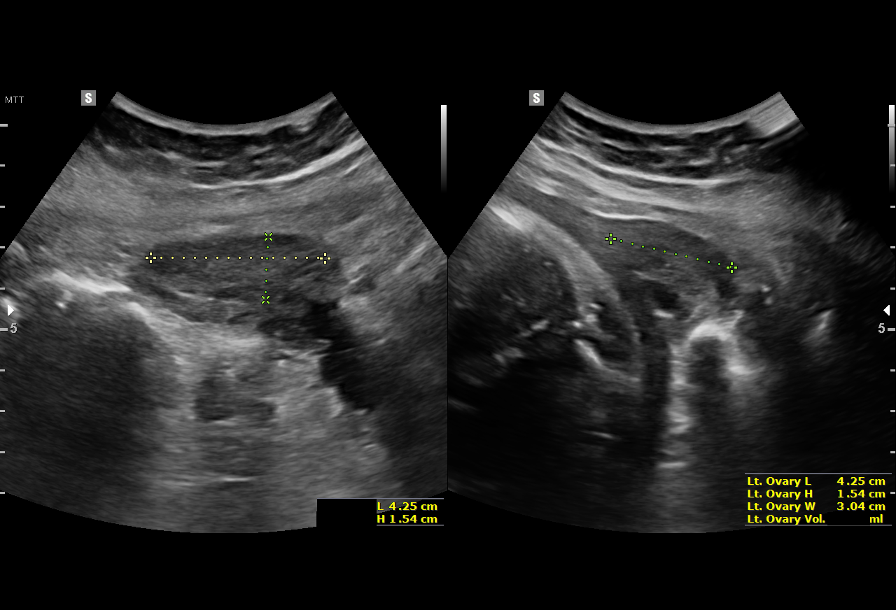
[im 31/56]
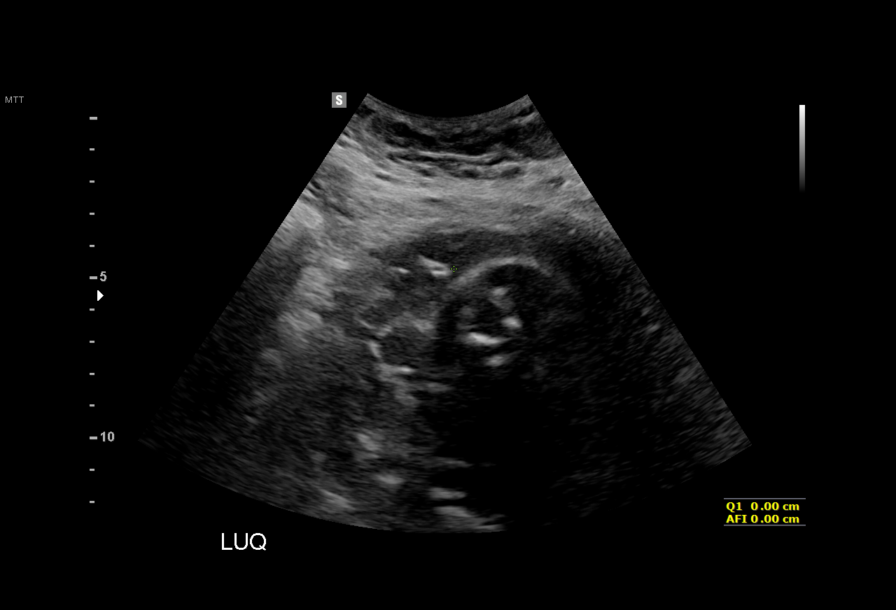
[im 35/56]
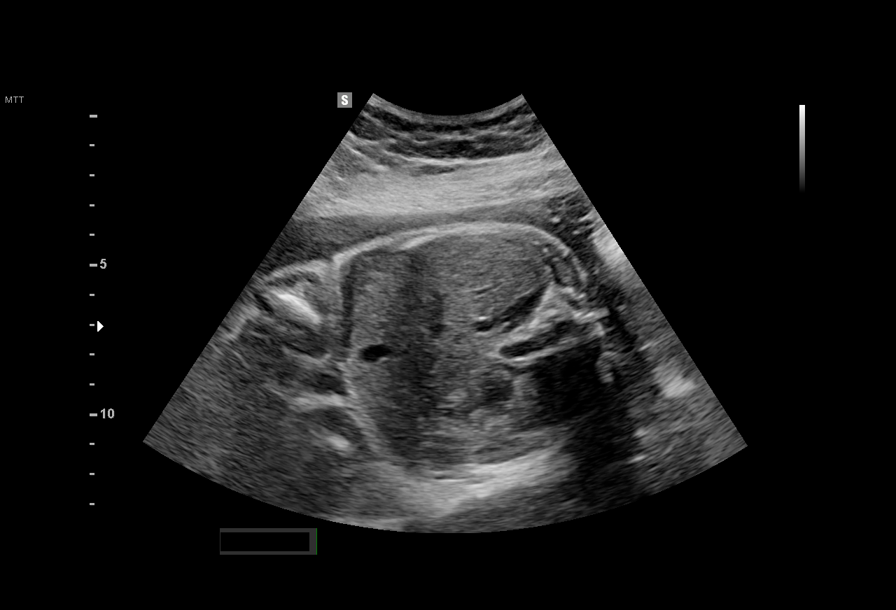
[im 39/56]
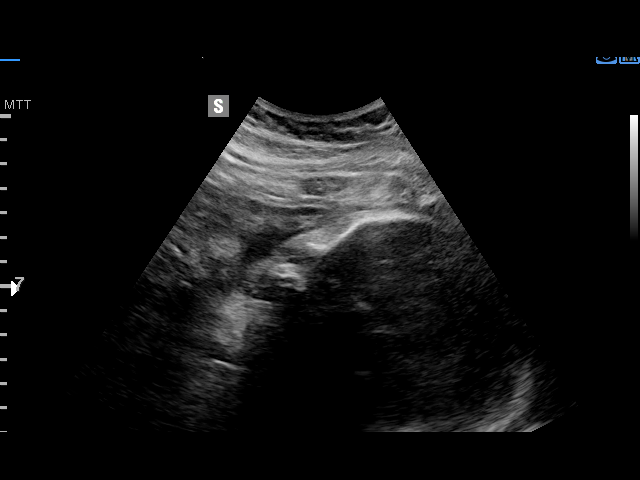
[im 45/56]
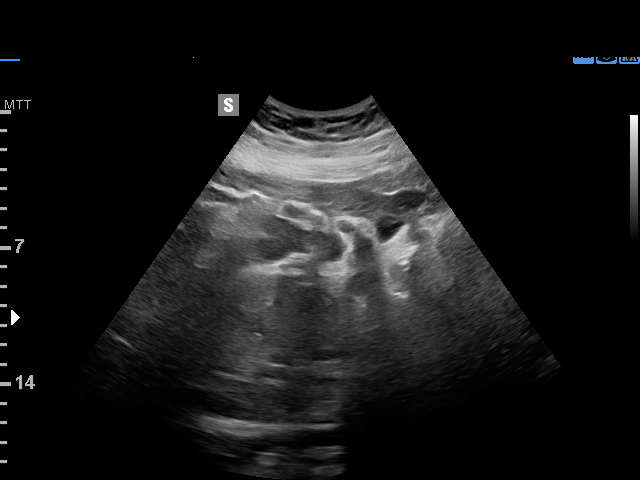
[im 49/56]
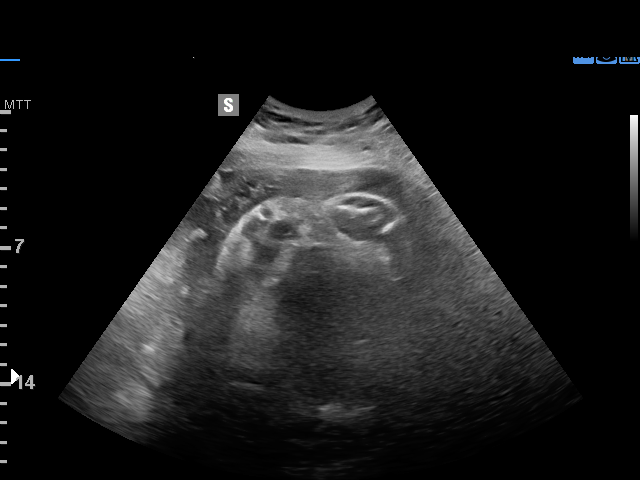
[im 53/56]
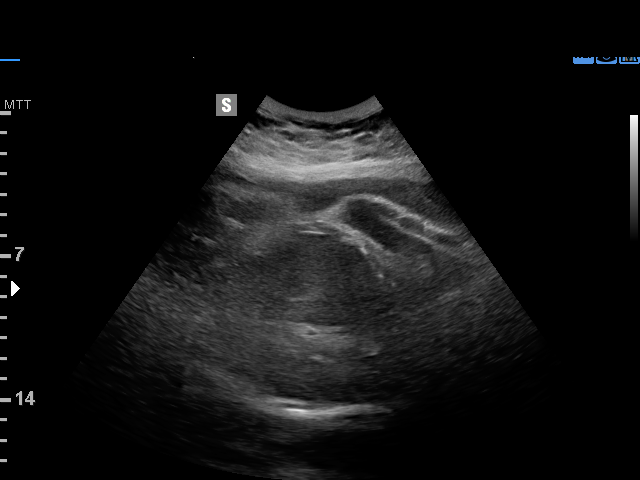

[12 of 28 positions shown; findings below may reference images not displayed]

[facility]

Obstetrics &
Gynecology
[address]
[place]
MAU/Triage

1  SUVI MARIA SIVONEN            [id number]       [id number]     [id number]
Indications

38 weeks gestation of pregnancy
Non-reactive NST
OB History

Gravidity:    1         Term:   0        Prem:   0        SAB:   0
TOP:          0       Ectopic:  0        Living: 0
Fetal Evaluation

Num Of Fetuses:     1
Fetal Heart         133
Rate(bpm):
Cardiac Activity:   Observed
Presentation:       Cephalic
Placenta:           Posterior, above cervical os
P. Cord Insertion:  Not well visualized

Amniotic Fluid
AFI FV:      Oligohydramnios

AFI Sum(cm)     %Tile       Largest Pocket(cm)
0.74            < 3

RUQ(cm)       RLQ(cm)       LUQ(cm)        LLQ(cm)
0             0             0.74           0
Biophysical Evaluation

Amniotic F.V:   Oligohydramnios            F. Tone:        Observed
F. Movement:    Observed                   Score:          [DATE]
F. Breathing:   Observed
Gestational Age

LMP:           38w 2d       Date:   09/20/15                 EDD:   06/26/16
Best:          38w 2d    Det. By:   LMP  (09/20/15)          EDD:   06/26/16
Cervix Uterus Adnexa

Cervix
Not visualized (advanced GA >81wks)

Uterus
Multiple fibroids noted, see table below.

Left Ovary
Size(cm)     4.25  x    3.04   x  1.54      Vol(ml):
Within normal limits. No adnexal mass visualized.

Right Ovary
Size(cm)     2.98  x    2.2    x  1.77      Vol(ml):
Within normal limits. No adnexal mass visualized.

Cul De Sac:   No free fluid seen.

Adnexa:       No abnormality visualized.
Myomas

Site                     L(cm)      W(cm)      D(cm)      Location
Anterior Fundus          3.2        3.2        2.3        Intramural
Anterior Right           2.3        2.6        1.2        Intramural

Blood Flow                 RI        PI       Comments

Impression

Single IUP at 38w 2d
BPP [DATE] (-2 for oligohydramnios)
Cephalic presentation
Uterine myomas noted as described above
Oligohydramnios noted - AFI < 1 cm with no identifiable 2 x 2
cm pockets
Recommendations

Recommend delivery due to oligohydramnios at term

## 2022-04-13 ENCOUNTER — Other Ambulatory Visit: Payer: Self-pay | Admitting: Student

## 2022-04-13 DIAGNOSIS — T849XXA Unspecified complication of internal orthopedic prosthetic device, implant and graft, initial encounter: Secondary | ICD-10-CM

## 2022-05-09 ENCOUNTER — Ambulatory Visit
Admission: RE | Admit: 2022-05-09 | Discharge: 2022-05-09 | Disposition: A | Discharge status: Home / Self Care | Payer: Self-pay | Source: Ambulatory Visit | Attending: Student | Admitting: Student

## 2022-05-09 DIAGNOSIS — T849XXA Unspecified complication of internal orthopedic prosthetic device, implant and graft, initial encounter: Secondary | ICD-10-CM
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
## 2022-08-07 ENCOUNTER — Other Ambulatory Visit: Payer: Self-pay | Admitting: Obstetrics & Gynecology

## 2022-08-07 DIAGNOSIS — Z793 Long term (current) use of hormonal contraceptives: Secondary | ICD-10-CM

## 2023-01-29 ENCOUNTER — Ambulatory Visit
Admission: RE | Admit: 2023-01-29 | Discharge: 2023-01-29 | Disposition: A | Discharge status: Home / Self Care | Payer: Medicaid Other | Source: Ambulatory Visit | Attending: Obstetrics & Gynecology | Admitting: Obstetrics & Gynecology

## 2023-01-29 DIAGNOSIS — Z793 Long term (current) use of hormonal contraceptives: Secondary | ICD-10-CM
